# Patient Record
Sex: Male | Born: 1965 | Hispanic: No | Smoking: Former smoker
Health system: Southern US, Community
[De-identification: ages and names within clinical notes are randomized; demographics above are authoritative.]

## PROBLEM LIST (undated history)

## (undated) DIAGNOSIS — E785 Hyperlipidemia, unspecified: Secondary | ICD-10-CM

## (undated) DIAGNOSIS — F419 Anxiety disorder, unspecified: Secondary | ICD-10-CM

## (undated) DIAGNOSIS — F329 Major depressive disorder, single episode, unspecified: Secondary | ICD-10-CM

## (undated) DIAGNOSIS — F32A Depression, unspecified: Secondary | ICD-10-CM

## (undated) DIAGNOSIS — M1990 Unspecified osteoarthritis, unspecified site: Secondary | ICD-10-CM

## (undated) DIAGNOSIS — N200 Calculus of kidney: Secondary | ICD-10-CM

## (undated) DIAGNOSIS — F209 Schizophrenia, unspecified: Secondary | ICD-10-CM

## (undated) DIAGNOSIS — R45851 Suicidal ideations: Secondary | ICD-10-CM

## (undated) DIAGNOSIS — F319 Bipolar disorder, unspecified: Secondary | ICD-10-CM

## (undated) DIAGNOSIS — K648 Other hemorrhoids: Secondary | ICD-10-CM

## (undated) DIAGNOSIS — I10 Essential (primary) hypertension: Secondary | ICD-10-CM

## (undated) DIAGNOSIS — G43909 Migraine, unspecified, not intractable, without status migrainosus: Secondary | ICD-10-CM

## (undated) DIAGNOSIS — R0981 Nasal congestion: Secondary | ICD-10-CM

## (undated) DIAGNOSIS — C44510 Basal cell carcinoma of anal skin: Secondary | ICD-10-CM

## (undated) DIAGNOSIS — Z87828 Personal history of other (healed) physical injury and trauma: Secondary | ICD-10-CM

## (undated) DIAGNOSIS — F410 Panic disorder [episodic paroxysmal anxiety] without agoraphobia: Secondary | ICD-10-CM

## (undated) DIAGNOSIS — K6289 Other specified diseases of anus and rectum: Secondary | ICD-10-CM

## (undated) DIAGNOSIS — F1410 Cocaine abuse, uncomplicated: Secondary | ICD-10-CM

## (undated) HISTORY — PX: MOUTH SURGERY: SHX715

## (undated) HISTORY — DX: Cocaine abuse, uncomplicated: F14.10

## (undated) HISTORY — DX: Hyperlipidemia, unspecified: E78.5

## (undated) HISTORY — DX: Schizophrenia, unspecified: F20.9

## (undated) HISTORY — PX: KNEE SURGERY: SHX244

## (undated) HISTORY — PX: OTHER SURGICAL HISTORY: SHX169

## (undated) HISTORY — DX: Suicidal ideations: R45.851

## (undated) HISTORY — PX: APPENDECTOMY: SHX54

## (undated) HISTORY — DX: Unspecified osteoarthritis, unspecified site: M19.90

---

## 2001-12-10 ENCOUNTER — Emergency Department (HOSPITAL_COMMUNITY): Admission: EM | Admit: 2001-12-10 | Discharge: 2001-12-10 | Payer: Self-pay | Admitting: Emergency Medicine

## 2001-12-10 ENCOUNTER — Encounter: Payer: Self-pay | Admitting: Emergency Medicine

## 2004-04-13 HISTORY — PX: COLONOSCOPY: SHX174

## 2004-07-31 ENCOUNTER — Emergency Department (HOSPITAL_COMMUNITY): Admission: EM | Admit: 2004-07-31 | Discharge: 2004-07-31 | Payer: Self-pay | Admitting: Emergency Medicine

## 2004-08-27 ENCOUNTER — Ambulatory Visit: Payer: Self-pay | Admitting: Internal Medicine

## 2004-10-16 ENCOUNTER — Ambulatory Visit: Payer: Self-pay | Admitting: Internal Medicine

## 2004-10-17 ENCOUNTER — Ambulatory Visit: Payer: Self-pay | Admitting: Internal Medicine

## 2004-10-21 ENCOUNTER — Ambulatory Visit: Payer: Self-pay | Admitting: *Deleted

## 2006-06-18 ENCOUNTER — Ambulatory Visit (HOSPITAL_COMMUNITY): Admission: RE | Admit: 2006-06-18 | Discharge: 2006-06-18 | Payer: Self-pay | Admitting: Family Medicine

## 2011-10-28 DIAGNOSIS — R079 Chest pain, unspecified: Secondary | ICD-10-CM

## 2011-10-30 DIAGNOSIS — R079 Chest pain, unspecified: Secondary | ICD-10-CM

## 2011-12-20 ENCOUNTER — Emergency Department (HOSPITAL_COMMUNITY)
Admission: EM | Admit: 2011-12-20 | Discharge: 2011-12-20 | Disposition: A | Discharge status: Home / Self Care | Payer: Medicaid Other | Attending: Emergency Medicine | Admitting: Emergency Medicine

## 2011-12-20 ENCOUNTER — Emergency Department (HOSPITAL_COMMUNITY): Payer: Medicaid Other

## 2011-12-20 ENCOUNTER — Encounter (HOSPITAL_COMMUNITY): Payer: Self-pay | Admitting: *Deleted

## 2011-12-20 DIAGNOSIS — R112 Nausea with vomiting, unspecified: Secondary | ICD-10-CM | POA: Insufficient documentation

## 2011-12-20 DIAGNOSIS — R079 Chest pain, unspecified: Secondary | ICD-10-CM | POA: Insufficient documentation

## 2011-12-20 DIAGNOSIS — Z79899 Other long term (current) drug therapy: Secondary | ICD-10-CM | POA: Insufficient documentation

## 2011-12-20 DIAGNOSIS — R5383 Other fatigue: Secondary | ICD-10-CM

## 2011-12-20 DIAGNOSIS — R5381 Other malaise: Secondary | ICD-10-CM | POA: Insufficient documentation

## 2011-12-20 DIAGNOSIS — I10 Essential (primary) hypertension: Secondary | ICD-10-CM | POA: Insufficient documentation

## 2011-12-20 HISTORY — DX: Migraine, unspecified, not intractable, without status migrainosus: G43.909

## 2011-12-20 HISTORY — DX: Calculus of kidney: N20.0

## 2011-12-20 HISTORY — DX: Major depressive disorder, single episode, unspecified: F32.9

## 2011-12-20 HISTORY — DX: Bipolar disorder, unspecified: F31.9

## 2011-12-20 HISTORY — DX: Essential (primary) hypertension: I10

## 2011-12-20 HISTORY — DX: Depression, unspecified: F32.A

## 2011-12-20 LAB — BASIC METABOLIC PANEL
BUN: 15 mg/dL (ref 6–23)
CO2: 23 mEq/L (ref 19–32)
Calcium: 9.5 mg/dL (ref 8.4–10.5)
Chloride: 109 mEq/L (ref 96–112)
Creatinine, Ser: 0.92 mg/dL (ref 0.50–1.35)
GFR calc Af Amer: >90 mL/min (ref >90)
GFR calc non Af Amer: >90 mL/min (ref >90)
Glucose, Bld: 101 mg/dL — ABNORMAL HIGH (ref 70–99)
Potassium: 3.7 mEq/L (ref 3.5–5.1)
Sodium: 140 mEq/L (ref 135–145)

## 2011-12-20 LAB — CBC
HCT: 41 % (ref 39.0–52.0)
Hemoglobin: 14.2 g/dL (ref 13.0–17.0)
MCH: 29.5 pg (ref 26.0–34.0)
MCHC: 34.6 g/dL (ref 30.0–36.0)
MCV: 85.1 fL (ref 78.0–100.0)
Platelets: 225 10*3/uL (ref 150–400)
RBC: 4.82 MIL/uL (ref 4.22–5.81)
RDW: 13.1 % (ref 11.5–15.5)
WBC: 6.5 10*3/uL (ref 4.0–10.5)

## 2011-12-20 LAB — TROPONIN I: Troponin I: <0.3 ng/mL (ref <0.30)

## 2011-12-20 MED ORDER — MORPHINE SULFATE 4 MG/ML IJ SOLN
6.0000 mg | Freq: Once | INTRAMUSCULAR | Status: AC
  Administered 2011-12-20: 6 mg via INTRAVENOUS
  Filled 2011-12-20: qty 2

## 2011-12-20 MED ORDER — ASPIRIN 81 MG PO CHEW
324.0000 mg | CHEWABLE_TABLET | Freq: Once | ORAL | Status: AC
  Administered 2011-12-20: 324 mg via ORAL
  Filled 2011-12-20: qty 4

## 2011-12-20 MED ORDER — IBUPROFEN 400 MG PO TABS
400.0000 mg | ORAL_TABLET | Freq: Once | ORAL | Status: AC
  Administered 2011-12-20: 400 mg via ORAL
  Filled 2011-12-20: qty 1

## 2011-12-20 MED ORDER — SODIUM CHLORIDE 0.9 % IV BOLUS (SEPSIS)
1000.0000 mL | Freq: Once | INTRAVENOUS | Status: AC
  Administered 2011-12-20: 1000 mL via INTRAVENOUS

## 2011-12-20 MED ORDER — ONDANSETRON HCL 4 MG/2ML IJ SOLN
4.0000 mg | Freq: Once | INTRAMUSCULAR | Status: AC
  Administered 2011-12-20: 4 mg via INTRAVENOUS
  Filled 2011-12-20: qty 2

## 2011-12-20 MED ORDER — TRAMADOL HCL 50 MG PO TABS: 50.0000 mg | ORAL_TABLET | Freq: Four times a day (QID) | ORAL | Status: AC | PRN

## 2011-12-20 NOTE — ED Provider Notes (Signed)
History    46 year old male with chest pain. Gradual onset about 3 days ago. Pain is in the Center of his chest and does not radiate. Describes it as a constant pressure with occasional sharper pain. No appreciable exacerbating relieving factors. Triage note was reviewed but patient denies shortness of breath to me. No palpitations. No diaphoresis. No unusual leg pain or swelling. Denies history of blood clot. No coughing. CSN: [id number]  Arrival date & time 12/20/11  1751   First MD Initiated Contact with Patient 12/20/11 1759      Chief Complaint  Patient presents with  . Chest Pain  . Nausea  . Emesis    (Consider location/radiation/quality/duration/timing/severity/associated sxs/prior treatment) HPI  Past Medical History  Diagnosis Date  . Bipolar disorder   . Depression   . Panic attack   . Kidney stones   . Hypertension   . Migraine     Past Surgical History  Procedure Date  . Knee surgery   . Mouth surgery     No family history on file.  History  Substance Use Topics  . Smoking status: Current Everyday Smoker  . Smokeless tobacco: Not on file  . Alcohol Use: Yes     occassional      Review of Systems   Review of symptoms negative unless otherwise noted in HPI.   Allergies  Rocephin  Home Medications   Current Outpatient Rx  Name Route Sig Dispense Refill  . ALPRAZOLAM 1 MG PO TABS Oral Take 1 mg by mouth 4 (four) times daily.    . IBUPROFEN 800 MG PO TABS Oral Take 800 mg by mouth every 8 (eight) hours as needed. Pain    . PENICILLIN V POTASSIUM 500 MG PO TABS Oral Take 500 mg by mouth 4 (four) times daily.    . QUETIAPINE FUMARATE 400 MG PO TABS Oral Take 400 mg by mouth at bedtime.      BP 141/89  Pulse 86  Temp 98.3 F (36.8 C)  Resp 18  Ht 5\' 8"  (1.727 m)  Wt 220 lb (99.791 kg)  BMI 33.45 kg/m2  SpO2 98%  Physical Exam  Nursing note and vitals reviewed. Constitutional: He appears well-developed and well-nourished. No distress.       Laying in bed. No acute distress  HENT:  Head: Normocephalic and atraumatic.  Eyes: Conjunctivae normal are normal. Right eye exhibits no discharge. Left eye exhibits no discharge.  Neck: Neck supple.  Cardiovascular: Normal rate, regular rhythm and normal heart sounds.  Exam reveals no gallop and no friction rub.   No murmur heard. Pulmonary/Chest: Effort normal and breath sounds normal. No respiratory distress.       Chest pain is nonreproducible on palpation.  Abdominal: Soft. He exhibits no distension. There is no tenderness.  Musculoskeletal: He exhibits no edema and no tenderness.       Lower extremities symmetric as compared to each other. No calf tenderness. Negative Homan's. No palpable cords.   Neurological: He is alert.  Skin: Skin is warm and dry. He is not diaphoretic.  Psychiatric: He has a normal mood and affect. His behavior is normal. Thought content normal.    ED Course  Procedures (including critical care time)  Labs Reviewed  BASIC METABOLIC PANEL - Abnormal; Notable for the following:    Glucose, Bld 101 (*)     All other components within normal limits  TROPONIN I  CBC   Dg Chest 2 View  12/20/2011  *RADIOLOGY REPORT*  Clinical Data: Chest pain and vomiting.  CHEST - 2 VIEW  Comparison: 10/28/2011.  Findings: The cardiac silhouette, mediastinal and hilar contours are normal and stable.  The lungs are clear.  No pleural effusion. The bony thorax is intact.  IMPRESSION: No acute cardiopulmonary findings.   Original Report Authenticated By: P. Loralie Champagne, M.D.    EKG:  Rhythm: normal sinus Vent. rate 86 BPM PR interval 168 ms QRS duration 90 ms QT/QTc 382/457 ms Axis: normal Intervals/conduction: Q waves in lead 3 ST segments: Normal    1. Chest pain   2. Fatigue       MDM  46 year old male with chest pain. Atypical for ACS given more or less constant pain for several days. Doubt infectious. Chest x-ray is clear. Doubt pulmonary embolism.  Doubt dissection. Feel very low risk chest pain in that patient can be discharged at this time. Return precautions were discussed. Outpatient followup.        Raeford Razor, MD 12/24/11 [phone]

## 2011-12-20 NOTE — ED Notes (Signed)
Mid center chest pain that started a few days ago, n/v, sob, pain is described as pressure that is sharp in nature. Pt also c/o headache

## 2012-03-18 ENCOUNTER — Ambulatory Visit (INDEPENDENT_AMBULATORY_CARE_PROVIDER_SITE_OTHER): Payer: Medicaid Other | Admitting: General Surgery

## 2012-03-18 ENCOUNTER — Encounter (INDEPENDENT_AMBULATORY_CARE_PROVIDER_SITE_OTHER): Payer: Self-pay | Admitting: General Surgery

## 2012-03-18 ENCOUNTER — Encounter (HOSPITAL_BASED_OUTPATIENT_CLINIC_OR_DEPARTMENT_OTHER): Payer: Self-pay | Admitting: *Deleted

## 2012-03-18 VITALS — BP 120/80 | HR 90 | Temp 97.6°F | Resp 12 | Ht 68.0 in | Wt 221.5 lb

## 2012-03-18 DIAGNOSIS — C44510 Basal cell carcinoma of anal skin: Secondary | ICD-10-CM

## 2012-03-18 DIAGNOSIS — C44519 Basal cell carcinoma of skin of other part of trunk: Secondary | ICD-10-CM

## 2012-03-18 HISTORY — DX: Basal cell carcinoma of anal skin: C44.510

## 2012-03-18 NOTE — Progress Notes (Signed)
NPO AFTER MN WITH EXCEPTION CLEAR LIQUIDS UNTIL 0800. (NO CREAM/ MILK PRODUCTS). PT VERBALIZED UNDERSTANDING THIS INCLUDES NO SNUFF/ CHEW.  ARRIVES AT 1300. NEEDS HG.

## 2012-03-18 NOTE — Progress Notes (Signed)
Chief Complaint  Patient presents with  . Cancer    Anal    HISTORY: Larry Hamilton is a 46 y.o. male who presents to the office with a perianal mass.  Other symptoms include bleeding and itching.  This had been present for 6 yrs.  His bowel habits are regular and his bowel movements are soft.  His fiber intake is moderate.    His last colonoscopy was in 2006 per pt and was neg except for internal hemorrhoids.  Past Medical History  Diagnosis Date  . Bipolar disorder   . Depression   . Panic attack   . Kidney stones   . Hypertension   . Migraine   . Arthritis   . Cancer   . Hyperlipidemia       Past Surgical History  Procedure Date  . Knee surgery   . Mouth surgery         Current Outpatient Prescriptions  Medication Sig Dispense Refill  . ALPRAZolam (XANAX) 1 MG tablet Take 1 mg by mouth 4 (four) times daily.      Marland Kitchen atorvastatin (LIPITOR) 40 MG tablet Take 40 mg by mouth daily.      Marland Kitchen ibuprofen (ADVIL,MOTRIN) 800 MG tablet Take 800 mg by mouth every 8 (eight) hours as needed. Pain      . QUEtiapine (SEROQUEL) 400 MG tablet Take 400 mg by mouth at bedtime.          Allergies  Allergen Reactions  . Rocephin (Ceftriaxone Sodium In Dextrose)       Family History  Problem Relation Age of Onset  . Cancer Father     Leukemia    History   Social History  . Marital Status: Single    Spouse Name: N/A    Number of Children: N/A  . Years of Education: N/A   Social History Main Topics  . Smoking status: Current Every Day Smoker  . Smokeless tobacco: None  . Alcohol Use: Yes     Comment: occassional  . Drug Use: No  . Sexually Active:    Other Topics Concern  . None   Social History Narrative  . None      REVIEW OF SYSTEMS - PERTINENT POSITIVES ONLY: Review of Systems - General ROS: negative for - chills or fever Hematological and Lymphatic ROS: negative for - bleeding problems or blood clots GI: no bleeding with BM's, pos for  occ abd pain,  epigastric  EXAM: Filed Vitals:   03/18/12 0922  BP: 120/80  Pulse: 90  Temp: 97.6 F (36.4 C)  Resp: 12    General appearance: alert and cooperative Resp: clear to auscultation bilaterally Cardio: regular rate and rhythm GI: soft, non-tender; bowel sounds normal; no masses,  no organomegaly,  Upper abd diastasis, no palpable hernias   Procedure: Anoscopy Surgeon: Maisie Fus Diagnosis: anal mass  Assistant: Glaspey After the risks and benefits were explained, verbal consent was obtained for above procedure  Anesthesia: none Findings: R ant internal hemorrhoid enlarged, R ant perianal lesion (~4x2cm)   ASSESSMENT AND PLAN: Larry Hamilton is a 46 y.o. M who was referred to me for a basal cell carcinoma of the perianal region.  He also appears to have an enlarged internal hemorrhoid.  We will perform a wide local excision of this mass.  If this does not require too much skin resection, I will proceed with hemorrhoidectomy as well.  I am hopeful this will resole his symptoms.  Vanita Panda, MD Colon and Rectal Surgery / General Surgery [facility]      Visit Diagnoses: 1. Primary basal cell carcinoma of anus     Primary Care Physician: Toma Deiters, MD

## 2012-03-18 NOTE — Patient Instructions (Addendum)
Shopping List- Supplies to purchase before rectal surgery  1. 100% cotton balls- you may also buy a roll of cotton and tear off a small piece or buy large cotton rounds in a bag.  You will wear a small piece next to the anal opening as a dressing following surgery.  Make sure they are 100% cotton and not rayon,which can be irritating to your skin.  2 Stool softener (Docusate Sodium or Colace)- The generic equivalent is fine.  These can be purchased over the counter.  You may want to start taking these (one in the morning and one in the evening) three days prior to surgery, so that you bowel movements after surgery will be soft.  Continue taking them until it no longer hurts to have a bowel movement.   3. Fiber supplement- such as Metamucil, FiberCon, Benefiber or Citrucel.  Take one dose in the morning and one in the evening to help reach a goal of 25g of fiber per day.  You can find this information on the label of the supplement. 4. Naproxen or Ibuprofen- this will be taken with your prescription pain medications to assist in your pain control after surgery. 5. Laxative- Milk of Magnesia or a generic equivalent will be fine.  You may need to use this if you become constipated after surgery. 6. Prune juice- just an ounce or two in the morning may keep you regular without having to use any laxatives 7. Portable Sitz bath (Optional)- recommended if you have difficulty getting in and out of a tub multiple times a day.

## 2012-03-21 ENCOUNTER — Encounter (HOSPITAL_BASED_OUTPATIENT_CLINIC_OR_DEPARTMENT_OTHER): Payer: Self-pay | Admitting: *Deleted

## 2012-03-21 ENCOUNTER — Ambulatory Visit (HOSPITAL_BASED_OUTPATIENT_CLINIC_OR_DEPARTMENT_OTHER): Payer: Medicaid Other | Admitting: Anesthesiology

## 2012-03-21 ENCOUNTER — Encounter (HOSPITAL_BASED_OUTPATIENT_CLINIC_OR_DEPARTMENT_OTHER): Payer: Self-pay | Admitting: Anesthesiology

## 2012-03-21 ENCOUNTER — Ambulatory Visit (HOSPITAL_BASED_OUTPATIENT_CLINIC_OR_DEPARTMENT_OTHER)
Admission: RE | Admit: 2012-03-21 | Discharge: 2012-03-21 | Disposition: A | Discharge status: Home / Self Care | Payer: Medicaid Other | Source: Ambulatory Visit | Attending: General Surgery | Admitting: General Surgery

## 2012-03-21 ENCOUNTER — Encounter (HOSPITAL_BASED_OUTPATIENT_CLINIC_OR_DEPARTMENT_OTHER)
Admission: RE | Disposition: A | Discharge status: Home / Self Care | Payer: Self-pay | Source: Ambulatory Visit | Attending: General Surgery

## 2012-03-21 DIAGNOSIS — C44519 Basal cell carcinoma of skin of other part of trunk: Secondary | ICD-10-CM

## 2012-03-21 DIAGNOSIS — Z79899 Other long term (current) drug therapy: Secondary | ICD-10-CM | POA: Insufficient documentation

## 2012-03-21 DIAGNOSIS — K648 Other hemorrhoids: Secondary | ICD-10-CM

## 2012-03-21 DIAGNOSIS — E785 Hyperlipidemia, unspecified: Secondary | ICD-10-CM | POA: Insufficient documentation

## 2012-03-21 DIAGNOSIS — I10 Essential (primary) hypertension: Secondary | ICD-10-CM | POA: Insufficient documentation

## 2012-03-21 HISTORY — DX: Other specified diseases of anus and rectum: K62.89

## 2012-03-21 HISTORY — PX: RECTAL BIOPSY: SHX2303

## 2012-03-21 HISTORY — DX: Anxiety disorder, unspecified: F41.9

## 2012-03-21 HISTORY — DX: Other hemorrhoids: K64.8

## 2012-03-21 HISTORY — DX: Basal cell carcinoma of anal skin: C44.510

## 2012-03-21 HISTORY — DX: Personal history of other (healed) physical injury and trauma: Z87.828

## 2012-03-21 HISTORY — DX: Nasal congestion: R09.81

## 2012-03-21 HISTORY — DX: Panic disorder (episodic paroxysmal anxiety): F41.0

## 2012-03-21 HISTORY — PX: HEMORRHOID SURGERY: SHX153

## 2012-03-21 LAB — POCT I-STAT, CHEM 8
BUN: 11 mg/dL (ref 6–23)
Chloride: 105 mEq/L (ref 96–112)
Glucose, Bld: 91 mg/dL (ref 70–99)
HCT: 42 % (ref 39.0–52.0)
Potassium: 3.7 mEq/L (ref 3.5–5.1)

## 2012-03-21 SURGERY — BIOPSY, RECTUM
Anesthesia: General | Site: Rectum | Wound class: Clean Contaminated

## 2012-03-21 MED ORDER — SODIUM CHLORIDE 0.9 % IV SOLN
250.0000 mL | INTRAVENOUS | Status: DC | PRN
  Filled 2012-03-21: qty 250

## 2012-03-21 MED ORDER — BUPIVACAINE LIPOSOME 1.3 % IJ SUSP
INTRAMUSCULAR | Status: DC | PRN
  Administered 2012-03-21: 20 mL

## 2012-03-21 MED ORDER — FENTANYL CITRATE 0.05 MG/ML IJ SOLN
INTRAMUSCULAR | Status: DC | PRN
  Administered 2012-03-21: 25 ug via INTRAVENOUS
  Administered 2012-03-21: 50 ug via INTRAVENOUS
  Administered 2012-03-21: 100 ug via INTRAVENOUS
  Administered 2012-03-21: 25 ug via INTRAVENOUS

## 2012-03-21 MED ORDER — LACTATED RINGERS IV SOLN
INTRAVENOUS | Status: DC
  Administered 2012-03-21: 14:00:00 via INTRAVENOUS
  Filled 2012-03-21: qty 1000

## 2012-03-21 MED ORDER — ONDANSETRON HCL 4 MG/2ML IJ SOLN
4.0000 mg | Freq: Four times a day (QID) | INTRAMUSCULAR | Status: DC | PRN
  Filled 2012-03-21: qty 2

## 2012-03-21 MED ORDER — KETOROLAC TROMETHAMINE 30 MG/ML IJ SOLN
INTRAMUSCULAR | Status: DC | PRN
  Administered 2012-03-21: 30 mg via INTRAVENOUS

## 2012-03-21 MED ORDER — LIDOCAINE HCL (CARDIAC) 20 MG/ML IV SOLN
INTRAVENOUS | Status: DC | PRN
  Administered 2012-03-21: 100 mg via INTRAVENOUS

## 2012-03-21 MED ORDER — ACETAMINOPHEN 325 MG PO TABS
650.0000 mg | ORAL_TABLET | ORAL | Status: DC | PRN
  Filled 2012-03-21: qty 2

## 2012-03-21 MED ORDER — ACETAMINOPHEN 650 MG RE SUPP
650.0000 mg | RECTAL | Status: DC | PRN
  Filled 2012-03-21: qty 1

## 2012-03-21 MED ORDER — PROMETHAZINE HCL 25 MG/ML IJ SOLN
6.2500 mg | INTRAMUSCULAR | Status: DC | PRN
  Filled 2012-03-21: qty 1

## 2012-03-21 MED ORDER — SODIUM CHLORIDE 0.9 % IJ SOLN
3.0000 mL | INTRAMUSCULAR | Status: DC | PRN
  Filled 2012-03-21: qty 3

## 2012-03-21 MED ORDER — MIDAZOLAM HCL 5 MG/5ML IJ SOLN
INTRAMUSCULAR | Status: DC | PRN
  Administered 2012-03-21: 2 mg via INTRAVENOUS

## 2012-03-21 MED ORDER — SUCCINYLCHOLINE CHLORIDE 20 MG/ML IJ SOLN
INTRAMUSCULAR | Status: DC | PRN
  Administered 2012-03-21: 200 mg via INTRAVENOUS

## 2012-03-21 MED ORDER — DIAZEPAM 5 MG PO TABS
5.0000 mg | ORAL_TABLET | Freq: Three times a day (TID) | ORAL | Status: AC | PRN
  Administered 2012-03-21: 5 mg via ORAL
  Filled 2012-03-21: qty 1

## 2012-03-21 MED ORDER — LACTATED RINGERS IV SOLN
INTRAVENOUS | Status: DC | PRN
  Administered 2012-03-21 (×2): via INTRAVENOUS

## 2012-03-21 MED ORDER — PSYLLIUM 28 % PO PACK: 1.0000 | PACK | Freq: Two times a day (BID) | ORAL | Status: DC

## 2012-03-21 MED ORDER — OXYCODONE HCL 5 MG PO TABS
5.0000 mg | ORAL_TABLET | ORAL | Status: DC | PRN
  Administered 2012-03-21: 5 mg via ORAL
  Filled 2012-03-21: qty 2

## 2012-03-21 MED ORDER — SODIUM CHLORIDE 0.9 % IJ SOLN
3.0000 mL | Freq: Two times a day (BID) | INTRAMUSCULAR | Status: DC
  Filled 2012-03-21: qty 3

## 2012-03-21 MED ORDER — FENTANYL CITRATE 0.05 MG/ML IJ SOLN
25.0000 ug | INTRAMUSCULAR | Status: DC | PRN
  Administered 2012-03-21: 25 ug via INTRAVENOUS
  Filled 2012-03-21: qty 1

## 2012-03-21 MED ORDER — DEXAMETHASONE SODIUM PHOSPHATE 4 MG/ML IJ SOLN
INTRAMUSCULAR | Status: DC | PRN
  Administered 2012-03-21: 10 mg via INTRAVENOUS

## 2012-03-21 MED ORDER — BUPIVACAINE-EPINEPHRINE 0.25% -1:200000 IJ SOLN
INTRAMUSCULAR | Status: DC | PRN
  Administered 2012-03-21: 20 mL

## 2012-03-21 MED ORDER — ONDANSETRON HCL 4 MG/2ML IJ SOLN
INTRAMUSCULAR | Status: DC | PRN
  Administered 2012-03-21: 4 mg via INTRAVENOUS

## 2012-03-21 MED ORDER — OXYCODONE HCL 5 MG PO TABS: 5.0000 mg | ORAL_TABLET | ORAL | Status: DC | PRN

## 2012-03-21 MED ORDER — PROPOFOL 10 MG/ML IV BOLUS
INTRAVENOUS | Status: DC | PRN
  Administered 2012-03-21: 200 mg via INTRAVENOUS
  Administered 2012-03-21: 50 mg via INTRAVENOUS

## 2012-03-21 MED ORDER — DOCUSATE SODIUM 100 MG PO CAPS: 100.0000 mg | ORAL_CAPSULE | Freq: Two times a day (BID) | ORAL | Status: DC

## 2012-03-21 MED ORDER — DIAZEPAM 5 MG PO TABS: 5.0000 mg | ORAL_TABLET | Freq: Four times a day (QID) | ORAL | Status: DC | PRN

## 2012-03-21 SURGICAL SUPPLY — 42 items
BLADE HEX COATED 2.75 (ELECTRODE) ×2 IMPLANT
BLADE SURG 15 STRL LF DISP TIS (BLADE) ×1 IMPLANT
BLADE SURG 15 STRL SS (BLADE) ×2
CANISTER SUCTION 2500CC (MISCELLANEOUS) ×2 IMPLANT
CLOTH BEACON ORANGE TIMEOUT ST (SAFETY) ×2 IMPLANT
DECANTER SPIKE VIAL GLASS SM (MISCELLANEOUS) ×2 IMPLANT
DRAPE LG THREE QUARTER DISP (DRAPES) ×4 IMPLANT
DRAPE UNDERBUTTOCKS STRL (DRAPE) ×2 IMPLANT
DRSG PAD ABDOMINAL 8X10 ST (GAUZE/BANDAGES/DRESSINGS) ×1 IMPLANT
ELECT REM PT RETURN 9FT ADLT (ELECTROSURGICAL) ×2
ELECTRODE REM PT RTRN 9FT ADLT (ELECTROSURGICAL) ×1 IMPLANT
EXPAREL IMPLANT
GAUZE SPONGE 4X4 16PLY XRAY LF (GAUZE/BANDAGES/DRESSINGS) IMPLANT
GAUZE VASELINE 3X9 (GAUZE/BANDAGES/DRESSINGS) IMPLANT
GLOVE BIO SURGEON STRL SZ 6.5 (GLOVE) ×4 IMPLANT
GLOVE BIOGEL PI IND STRL 7.0 (GLOVE) ×2 IMPLANT
GLOVE BIOGEL PI INDICATOR 7.0 (GLOVE) ×2
GOWN PREVENTION PLUS LG XLONG (DISPOSABLE) ×2 IMPLANT
GOWN PREVENTION PLUS XLARGE (GOWN DISPOSABLE) ×2 IMPLANT
GOWN STRL NON-REIN LRG LVL3 (GOWN DISPOSABLE) ×2 IMPLANT
GOWN STRL REIN XL XLG (GOWN DISPOSABLE) ×2 IMPLANT
LEGGING LITHOTOMY PAIR STRL (DRAPES) ×2 IMPLANT
NDL HYPO 25X1 1.5 SAFETY (NEEDLE) ×1 IMPLANT
NDL SAFETY ECLIPSE 18X1.5 (NEEDLE) IMPLANT
NEEDLE HYPO 18GX1.5 SHARP (NEEDLE)
NEEDLE HYPO 25X1 1.5 SAFETY (NEEDLE) ×2 IMPLANT
NS IRRIG 500ML POUR BTL (IV SOLUTION) ×2 IMPLANT
PACK BASIN DAY SURGERY FS (CUSTOM PROCEDURE TRAY) ×2 IMPLANT
PENCIL BUTTON HOLSTER BLD 10FT (ELECTRODE) ×2 IMPLANT
SPONGE GAUZE 4X4 12PLY (GAUZE/BANDAGES/DRESSINGS) ×1 IMPLANT
SPONGE SURGIFOAM ABS GEL 100 (HEMOSTASIS) ×1 IMPLANT
SPONGE SURGIFOAM ABS GEL 12-7 (HEMOSTASIS) IMPLANT
SUT CHROMIC 2 0 SH (SUTURE) ×1 IMPLANT
SUT CHROMIC 3 0 SH 27 (SUTURE) ×1 IMPLANT
SUT MON AB 3-0 SH 27 (SUTURE)
SUT MON AB 3-0 SH27 (SUTURE) IMPLANT
SUT VIC AB 4-0 P-3 18XBRD (SUTURE) IMPLANT
SUT VIC AB 4-0 P3 18 (SUTURE)
SYR CONTROL 10ML LL (SYRINGE) ×2 IMPLANT
TOWEL OR 17X26 10 PK STRL BLUE (TOWEL DISPOSABLE) ×2 IMPLANT
TRAY DSU PREP LF (CUSTOM PROCEDURE TRAY) ×2 IMPLANT
TUBE CONNECTING 12X1/4 (SUCTIONS) ×2 IMPLANT

## 2012-03-21 NOTE — Op Note (Signed)
03/21/2012  3:22 PM  PATIENT:  Larry Hamilton  46 y.o. male  Patient Care Team: Toma Deiters, MD as PCP - General (Unknown Physician Specialty)  PRE-OPERATIVE DIAGNOSIS:  ANAL MASS AND HEMORRHOIDS  POST-OPERATIVE DIAGNOSIS:  ANAL MASS AND HEMORRHOIDS  PROCEDURE:  Procedure(s): Wide local excision, perianal region (~3cm x2cm) HEMORRHOIDECTOMY  SURGEON:  Surgeon(s): Romie Levee, MD  ASSISTANT: none   ANESTHESIA:   local and general  EBL: 20ml  Total I/O In: 1000 [I.V.:1000] Out: -   Delay start of Pharmacological VTE agent (>24hrs) due to surgical blood loss or risk of bleeding:  not applicable  DRAINS: none   SPECIMEN:  Source of Specimen:  hemorrhoid, anterior  Right lateral perianal region basal cell carcinoma  DISPOSITION OF SPECIMEN:  PATHOLOGY  COUNTS:  YES  PLAN OF CARE: Discharge to home after PACU  PATIENT DISPOSITION:  PACU - hemodynamically stable.  INDICATION: this is a 46 year old male who presents to my office with a biopsy-proven basal cell carcinoma of the perianal region as well as internal hemorrhoids.  OR FINDINGS: right lateral perianal basal cell carcinoma. Small internal anterior anal canal hemorrhoid  DESCRIPTION: the patient was identified in the preoperative holding area and taken to the OR where general anesthesia was smoothly induced. He was then laid prone on the operating room table in jackknife position. His buttocks were gently taped apart and he was prepped and draped in the usual sterile fashion. A surgical timeout was performed and again the correct patient, procedure, positioning and preoperative antibiotics need. SCDs were also been placed prior to the initiation of anesthesia. Once this was completed and internal anal exam was performed using a Hill-Ferguson anoscope. The patient was noted to have a small anterior internal hemorrhoid and no other major internal disease. He was noted to have a perianal mass consistent with basal  cell carcinoma in the right lateral position. I began with the internal hemorrhoidectomy first. This was incised using a scalpel and carefully dissected from the muscle layer. This was sent to pathology for further examination. The hemorrhoid excision site was closed using a 3-0 chromic running suture. After this was completed I turned my attention to the basal cell carcinoma. Approximately 5 mm margin was attempted on all sides. An elliptical incision was made around the basal cell carcinoma using an 15 blade scalpel. I then used Metzenbaum scissors to separate the lesion from the underlying muscle layer. I marked the lesion with a long stitch on the anterior border and a short stitch on the perianal border away from the anal canal.  This was sent to pathology for further examination. I then closed the excision site using a running 2-0 chromic suture. I then infused exparel in to the subcutaneous tissues for anesthesia locally. The patient was then awakened from anesthesia and sent to the postanesthesia care unit in stable condition. All counts were correct per operating room staff.

## 2012-03-21 NOTE — Anesthesia Postprocedure Evaluation (Signed)
  Anesthesia Post-op Note  Patient: Larry Hamilton  Procedure(s) Performed: Procedure(s) (LRB): BIOPSY RECTAL (N/A) HEMORRHOIDECTOMY (N/A)  Patient Location: PACU  Anesthesia Type: General  Level of Consciousness: awake and alert   Airway and Oxygen Therapy: Patient Spontanous Breathing  Post-op Pain: mild  Post-op Assessment: Post-op Vital signs reviewed, Patient's Cardiovascular Status Stable, Respiratory Function Stable, Patent Airway and No signs of Nausea or vomiting  Last Vitals:  Filed Vitals:   03/21/12 1555  BP: 137/88  Pulse:   Temp:   Resp:     Post-op Vital Signs: stable   Complications: No apparent anesthesia complications. Patient is wide awake with no sedation. Taking PO well. No complaints. Wife at bedside.

## 2012-03-21 NOTE — H&P (View-Only) (Signed)
Chief Complaint  Patient presents with  . Cancer    Anal    HISTORY: Larry Hamilton is a 46 y.o. male who presents to the office with a perianal mass.  Other symptoms include bleeding and itching.  This had been present for 6 yrs.  His bowel habits are regular and his bowel movements are soft.  His fiber intake is moderate.    His last colonoscopy was in 2006 per pt and was neg except for internal hemorrhoids.  Past Medical History  Diagnosis Date  . Bipolar disorder   . Depression   . Panic attack   . Kidney stones   . Hypertension   . Migraine   . Arthritis   . Cancer   . Hyperlipidemia       Past Surgical History  Procedure Date  . Knee surgery   . Mouth surgery         Current Outpatient Prescriptions  Medication Sig Dispense Refill  . ALPRAZolam (XANAX) 1 MG tablet Take 1 mg by mouth 4 (four) times daily.      . atorvastatin (LIPITOR) 40 MG tablet Take 40 mg by mouth daily.      . ibuprofen (ADVIL,MOTRIN) 800 MG tablet Take 800 mg by mouth every 8 (eight) hours as needed. Pain      . QUEtiapine (SEROQUEL) 400 MG tablet Take 400 mg by mouth at bedtime.          Allergies  Allergen Reactions  . Rocephin (Ceftriaxone Sodium In Dextrose)       Family History  Problem Relation Age of Onset  . Cancer Father     Leukemia    History   Social History  . Marital Status: Single    Spouse Name: N/A    Number of Children: N/A  . Years of Education: N/A   Social History Main Topics  . Smoking status: Current Every Day Smoker  . Smokeless tobacco: None  . Alcohol Use: Yes     Comment: occassional  . Drug Use: No  . Sexually Active:    Other Topics Concern  . None   Social History Narrative  . None      REVIEW OF SYSTEMS - PERTINENT POSITIVES ONLY: Review of Systems - General ROS: negative for - chills or fever Hematological and Lymphatic ROS: negative for - bleeding problems or blood clots GI: no bleeding with BM's, pos for  occ abd pain,  epigastric  EXAM: Filed Vitals:   03/18/12 0922  BP: 120/80  Pulse: 90  Temp: 97.6 F (36.4 C)  Resp: 12    General appearance: alert and cooperative Resp: clear to auscultation bilaterally Cardio: regular rate and rhythm GI: soft, non-tender; bowel sounds normal; no masses,  no organomegaly,  Upper abd diastasis, no palpable hernias   Procedure: Anoscopy Surgeon: Vontae Court Diagnosis: anal mass  Assistant: Glaspey After the risks and benefits were explained, verbal consent was obtained for above procedure  Anesthesia: none Findings: R ant internal hemorrhoid enlarged, R ant perianal lesion (~4x2cm)   ASSESSMENT AND PLAN: Larry Hamilton is a 46 y.o. M who was referred to me for a basal cell carcinoma of the perianal region.  He also appears to have an enlarged internal hemorrhoid.  We will perform a wide local excision of this mass.  If this does not require too much skin resection, I will proceed with hemorrhoidectomy as well.  I am hopeful this will resole his symptoms.        Apple Dearmas C Romolo Sieling, MD Colon and Rectal Surgery / General Surgery [facility]      Visit Diagnoses: 1. Primary basal cell carcinoma of anus     Primary Care Physician: HASANAJ,XAJE A, MD   

## 2012-03-21 NOTE — Transfer of Care (Signed)
Immediate Anesthesia Transfer of Care Note  Patient: Larry Hamilton  Procedure(s) Performed: Procedure(s) (LRB): BIOPSY RECTAL (N/A) HEMORRHOIDECTOMY (N/A)  Patient Location: PACU  Anesthesia Type: General  Level of Consciousness: awake, sedated, patient cooperative and responds to stimulation  Airway & Oxygen Therapy: Patient Spontanous Breathing and Patient connected to face mask oxygen  Post-op Assessment: Report given to PACU RN, Post -op Vital signs reviewed and stable and Patient moving all extremities  Post vital signs: Reviewed and stable  Complications: No apparent anesthesia complications

## 2012-03-21 NOTE — Anesthesia Procedure Notes (Signed)
Procedure Name: Intubation Date/Time: 03/21/2012 2:26 PM Performed by: Jessica Priest Pre-anesthesia Checklist: Patient identified, Emergency Drugs available, Suction available and Patient being monitored Patient Re-evaluated:Patient Re-evaluated prior to inductionOxygen Delivery Method: Circle System Utilized Preoxygenation: Pre-oxygenation with 100% oxygen Intubation Type: Rapid sequence Ventilation: Mask ventilation without difficulty Laryngoscope Size: Mac and 4 Grade View: Grade IV Tube type: Oral Tube size: 8.0 mm Number of attempts: 3 Airway Equipment and Method: stylet,  oral airway,  Bougie stylet and Stylet Placement Confirmation: ETT inserted through vocal cords under direct vision,  positive ETCO2 and breath sounds checked- equal and bilateral Secured at: 25 cm Tube secured with: Tape Dental Injury: Teeth and Oropharynx as per pre-operative assessment  Difficulty Due To: Difficult Airway- due to anterior larynx Comments: LCB attemptedf oral intubation - DL x 1, unable to visualize, Dr Payton Doughty assisted, using bouge able to intubate blindly BBS equal positive ETCO2  Sao2 remained above 96 % Taped well.

## 2012-03-21 NOTE — Anesthesia Preprocedure Evaluation (Signed)
Anesthesia Evaluation  Patient identified by MRN, date of birth, ID band Patient awake    Reviewed: Allergy & Precautions, H&P , NPO status , Patient's Chart, lab work & pertinent test results, reviewed documented beta blocker date and time   Airway Mallampati: II TM Distance: >3 FB Neck ROM: full    Dental No notable dental hx.    Pulmonary neg pulmonary ROS,  breath sounds clear to auscultation  Pulmonary exam normal       Cardiovascular Exercise Tolerance: Good hypertension, Rhythm:regular Rate:Normal     Neuro/Psych  Headaches, PSYCHIATRIC DISORDERS Anxiety Depression Bipolar Disorder Panic attacks.S/P closed head injury with hematoma. Residual headaches. negative neurological ROS     GI/Hepatic negative GI ROS, Neg liver ROS,   Endo/Other  negative endocrine ROS  Renal/GU Renal diseasenegative Renal ROS  negative genitourinary   Musculoskeletal   Abdominal   Peds  Hematology negative hematology ROS (+)   Anesthesia Other Findings   Reproductive/Obstetrics negative OB ROS                           Anesthesia Physical Anesthesia Plan  ASA: III  Anesthesia Plan: General ETT   Post-op Pain Management:    Induction:   Airway Management Planned:   Additional Equipment:   Intra-op Plan:   Post-operative Plan:   Informed Consent: I have reviewed the patients History and Physical, chart, labs and discussed the procedure including the risks, benefits and alternatives for the proposed anesthesia with the patient or authorized representative who has indicated his/her understanding and acceptance.   Dental Advisory Given  Plan Discussed with: CRNA  Anesthesia Plan Comments:         Anesthesia Quick Evaluation

## 2012-03-21 NOTE — Interval H&P Note (Signed)
History and Physical Interval Note:  03/21/2012 1:59 PM  Larry Hamilton  has presented today for surgery, with the diagnosis of ANAL MASS AND HEMORRHOIDS  The various methods of treatment have been discussed with the patient and family. After consideration of risks, benefits and other options for treatment, the patient has consented to  Procedure(s) (LRB) with comments: BIOPSY RECTAL (N/A) - WIDE LOCAL EXCISON OF ANAL MASS, POSSIBLE HEMORRHOIDECTOMY HEMORRHOIDECTOMY (N/A) as a surgical intervention .  The patient's history has been reviewed, patient examined, no change in status, stable for surgery.  I have reviewed the patient's chart and labs.  Questions were answered to the patient's satisfaction.   Prior to the operation we did discuss the risks and benefits of this procedure. These include recurrence of his cancer recurrence of this hemorrhoid. As well as short term complications of bleeding, infection, inability to urinate and postoperative pain. I believe he understands these risk and has agreed to proceed with surgery.  Vanita Panda, MD  Colorectal and General Surgery [facility]

## 2012-03-22 ENCOUNTER — Encounter (HOSPITAL_BASED_OUTPATIENT_CLINIC_OR_DEPARTMENT_OTHER): Payer: Self-pay | Admitting: General Surgery

## 2012-03-28 ENCOUNTER — Telehealth (INDEPENDENT_AMBULATORY_CARE_PROVIDER_SITE_OTHER): Payer: Self-pay

## 2012-03-28 NOTE — Telephone Encounter (Signed)
I tried to reach the pt to let him know Dr Maisie Fus wants to speak with him.  I was unable to get him to answer.

## 2012-03-28 NOTE — Telephone Encounter (Signed)
Message copied by Ivory Broad on Mon Mar 28, 2012  3:28 PM ------      Message from: Isaias Sakai K      Created: Mon Mar 28, 2012  3:02 PM      Regarding: Dr Maisie Fus      Contact: [phone]       Patient rx filled (oxycodone and diazepam). Says he will pick up @ [facility] in [place] 

## 2012-03-29 NOTE — Telephone Encounter (Signed)
I rec'd a call from the pt and he is going to his mom's house today.  He will be at [phone].  I will let Dr Maisie Fus know.

## 2012-03-30 ENCOUNTER — Telehealth (INDEPENDENT_AMBULATORY_CARE_PROVIDER_SITE_OTHER): Payer: Self-pay

## 2012-03-30 NOTE — Telephone Encounter (Signed)
I have tried to call him multiple times on multiple days using 3 different numbers and no one has answered.

## 2012-03-30 NOTE — Telephone Encounter (Signed)
Patient calling into office requesting to speak with Dr. Maisie Fus.  Patient would like a return call.

## 2012-03-31 ENCOUNTER — Telehealth (INDEPENDENT_AMBULATORY_CARE_PROVIDER_SITE_OTHER): Payer: Self-pay

## 2012-03-31 NOTE — Telephone Encounter (Signed)
I contacted the pt.  He is at his girlfriend's house.  He was upstairs in bed and didn't answer the phone.   His girlfriend got him to the phone.  He is in a lot of pain and feels like he has to have a bm all the time.  He is on metamucil, prune juice and a stool softener.  I told him Dr Maisie Fus has been playing phone tag trying to reach him.  I explained to him about his pathology result having a positive margin.  She wants to take him back to surgery asap.  He asked about refilling his pain medicine again.  I told him she declined it until he has surgery again.  I told him we will need to call him back about surgery to make sure he picks up the phone.  He said we can talk to his girlfriend Inetta Fermo but I said we have nothing signed stating we can.  He will be at her house today at [phone].

## 2012-04-01 ENCOUNTER — Telehealth (INDEPENDENT_AMBULATORY_CARE_PROVIDER_SITE_OTHER): Payer: Self-pay

## 2012-04-01 NOTE — Telephone Encounter (Signed)
I called the pt and let him know Dr Maisie Fus wants to give him Vicodin #40. I will call this in to [facility] in [place].  I made sure he is doing sitz baths tid.  He is 3 to 4 times a day.  He is awaiting a call back from Debbie to get scheduled.  I called in Vicodin 5/500 # 40 take 1 po q 4-6 prn no refill to [facility] [phone]

## 2012-04-04 ENCOUNTER — Telehealth (INDEPENDENT_AMBULATORY_CARE_PROVIDER_SITE_OTHER): Payer: Self-pay

## 2012-04-04 ENCOUNTER — Encounter (HOSPITAL_COMMUNITY): Payer: Self-pay | Admitting: Pharmacy Technician

## 2012-04-04 ENCOUNTER — Encounter (HOSPITAL_COMMUNITY): Payer: Self-pay | Admitting: *Deleted

## 2012-04-04 ENCOUNTER — Other Ambulatory Visit (INDEPENDENT_AMBULATORY_CARE_PROVIDER_SITE_OTHER): Payer: Self-pay | Admitting: General Surgery

## 2012-04-04 NOTE — Telephone Encounter (Signed)
I called the pt back.  He asked if there is any prep required and I told him there is not.

## 2012-04-04 NOTE — Progress Notes (Signed)
Dr Maisie Fus-   Need pre op orders please entered in EPIC-   FIRST CASE Thursday  [facility]

## 2012-04-04 NOTE — Telephone Encounter (Signed)
Message copied by Ivory Broad on Mon Apr 04, 2012 11:50 AM ------      Message from: Larry Hamilton      Created: Mon Apr 04, 2012 10:45 AM      Regarding: Dr Glenna Durand: [phone]       Patient would like to discuss sx on 04/07/12. Has question about taking his medications

## 2012-04-07 ENCOUNTER — Encounter (HOSPITAL_BASED_OUTPATIENT_CLINIC_OR_DEPARTMENT_OTHER): Payer: Self-pay

## 2012-04-07 ENCOUNTER — Encounter (HOSPITAL_COMMUNITY): Payer: Self-pay | Admitting: Anesthesiology

## 2012-04-07 ENCOUNTER — Encounter (HOSPITAL_COMMUNITY): Payer: Self-pay | Admitting: *Deleted

## 2012-04-07 ENCOUNTER — Encounter (HOSPITAL_COMMUNITY)
Admission: RE | Disposition: A | Discharge status: Home / Self Care | Payer: Self-pay | Source: Ambulatory Visit | Attending: General Surgery

## 2012-04-07 ENCOUNTER — Ambulatory Visit (HOSPITAL_COMMUNITY)
Admission: RE | Admit: 2012-04-07 | Discharge: 2012-04-07 | Disposition: A | Discharge status: Home / Self Care | Payer: Medicaid Other | Source: Ambulatory Visit | Attending: General Surgery | Admitting: General Surgery

## 2012-04-07 ENCOUNTER — Ambulatory Visit (HOSPITAL_BASED_OUTPATIENT_CLINIC_OR_DEPARTMENT_OTHER): Admit: 2012-04-07 | Payer: Self-pay | Admitting: General Surgery

## 2012-04-07 ENCOUNTER — Ambulatory Visit (HOSPITAL_COMMUNITY): Payer: Medicaid Other | Admitting: Anesthesiology

## 2012-04-07 DIAGNOSIS — C4491 Basal cell carcinoma of skin, unspecified: Secondary | ICD-10-CM | POA: Insufficient documentation

## 2012-04-07 DIAGNOSIS — R198 Other specified symptoms and signs involving the digestive system and abdomen: Secondary | ICD-10-CM | POA: Insufficient documentation

## 2012-04-07 DIAGNOSIS — E785 Hyperlipidemia, unspecified: Secondary | ICD-10-CM | POA: Insufficient documentation

## 2012-04-07 DIAGNOSIS — C44519 Basal cell carcinoma of skin of other part of trunk: Secondary | ICD-10-CM

## 2012-04-07 DIAGNOSIS — I10 Essential (primary) hypertension: Secondary | ICD-10-CM | POA: Insufficient documentation

## 2012-04-07 DIAGNOSIS — C44510 Basal cell carcinoma of anal skin: Secondary | ICD-10-CM

## 2012-04-07 DIAGNOSIS — Z79899 Other long term (current) drug therapy: Secondary | ICD-10-CM | POA: Insufficient documentation

## 2012-04-07 HISTORY — PX: TRANSANAL EXCISION OF RECTAL MASS: SHX6134

## 2012-04-07 LAB — CBC
MCV: 84.1 fL (ref 78.0–100.0)
Platelets: 246 10*3/uL (ref 150–400)
RBC: 4.85 MIL/uL (ref 4.22–5.81)
RDW: 13 % (ref 11.5–15.5)
WBC: 7.6 10*3/uL (ref 4.0–10.5)

## 2012-04-07 LAB — SURGICAL PCR SCREEN
MRSA, PCR: NEGATIVE
Staphylococcus aureus: NEGATIVE

## 2012-04-07 SURGERY — EXCISION, MASS, RECTUM, ANAL APPROACH
Anesthesia: General | Site: Anus

## 2012-04-07 SURGERY — EXCISION, MASS, RECTUM, ANAL APPROACH
Anesthesia: General | Site: Anus | Wound class: Clean Contaminated

## 2012-04-07 MED ORDER — OXYCODONE HCL 5 MG PO TABS: 5.0000 mg | ORAL_TABLET | ORAL | Status: DC | PRN

## 2012-04-07 MED ORDER — ACETAMINOPHEN 325 MG PO TABS: 650.0000 mg | ORAL_TABLET | ORAL | Status: DC | PRN

## 2012-04-07 MED ORDER — ACETAMINOPHEN 650 MG RE SUPP
650.0000 mg | RECTAL | Status: DC | PRN
  Filled 2012-04-07: qty 1

## 2012-04-07 MED ORDER — ONDANSETRON HCL 4 MG/2ML IJ SOLN
INTRAMUSCULAR | Status: DC | PRN
  Administered 2012-04-07: 4 mg via INTRAVENOUS

## 2012-04-07 MED ORDER — KETOROLAC TROMETHAMINE 30 MG/ML IJ SOLN: 15.0000 mg | Freq: Once | INTRAMUSCULAR | Status: DC | PRN

## 2012-04-07 MED ORDER — ROCURONIUM BROMIDE 100 MG/10ML IV SOLN
INTRAVENOUS | Status: DC | PRN
  Administered 2012-04-07: 30 mg via INTRAVENOUS

## 2012-04-07 MED ORDER — NEOSTIGMINE METHYLSULFATE 1 MG/ML IJ SOLN
INTRAMUSCULAR | Status: DC | PRN
  Administered 2012-04-07: 4 mg via INTRAVENOUS

## 2012-04-07 MED ORDER — LIDOCAINE HCL 2 % EX GEL
CUTANEOUS | Status: AC
  Filled 2012-04-07: qty 10

## 2012-04-07 MED ORDER — PROPOFOL 10 MG/ML IV BOLUS
INTRAVENOUS | Status: DC | PRN
  Administered 2012-04-07: 200 mg via INTRAVENOUS

## 2012-04-07 MED ORDER — LACTATED RINGERS IV SOLN: INTRAVENOUS | Status: DC

## 2012-04-07 MED ORDER — OXYCODONE HCL 5 MG PO TABS
ORAL_TABLET | ORAL | Status: AC
  Filled 2012-04-07: qty 1

## 2012-04-07 MED ORDER — BUPIVACAINE-EPINEPHRINE PF 0.25-1:200000 % IJ SOLN
INTRAMUSCULAR | Status: AC
  Filled 2012-04-07: qty 30

## 2012-04-07 MED ORDER — GLYCOPYRROLATE 0.2 MG/ML IJ SOLN
INTRAMUSCULAR | Status: DC | PRN
  Administered 2012-04-07: .7 mg via INTRAVENOUS

## 2012-04-07 MED ORDER — FENTANYL CITRATE 0.05 MG/ML IJ SOLN
INTRAMUSCULAR | Status: DC | PRN
  Administered 2012-04-07 (×3): 50 ug via INTRAVENOUS
  Administered 2012-04-07: 100 ug via INTRAVENOUS

## 2012-04-07 MED ORDER — 0.9 % SODIUM CHLORIDE (POUR BTL) OPTIME
TOPICAL | Status: DC | PRN
  Administered 2012-04-07: 1000 mL

## 2012-04-07 MED ORDER — DOCUSATE SODIUM 100 MG PO CAPS: 100.0000 mg | ORAL_CAPSULE | Freq: Two times a day (BID) | ORAL | Status: DC

## 2012-04-07 MED ORDER — FENTANYL CITRATE 0.05 MG/ML IJ SOLN
25.0000 ug | INTRAMUSCULAR | Status: DC | PRN
  Administered 2012-04-07 (×2): 50 ug via INTRAVENOUS

## 2012-04-07 MED ORDER — BUPIVACAINE LIPOSOME 1.3 % IJ SUSP
20.0000 mL | Freq: Once | INTRAMUSCULAR | Status: AC
  Administered 2012-04-07: 20 mL
  Filled 2012-04-07: qty 20

## 2012-04-07 MED ORDER — SUCCINYLCHOLINE CHLORIDE 20 MG/ML IJ SOLN
INTRAMUSCULAR | Status: DC | PRN
  Administered 2012-04-07: 100 mg via INTRAVENOUS

## 2012-04-07 MED ORDER — DEXAMETHASONE SODIUM PHOSPHATE 10 MG/ML IJ SOLN
INTRAMUSCULAR | Status: DC | PRN
  Administered 2012-04-07: 10 mg via INTRAVENOUS

## 2012-04-07 MED ORDER — SODIUM CHLORIDE 0.9 % IV SOLN: 250.0000 mL | INTRAVENOUS | Status: DC | PRN

## 2012-04-07 MED ORDER — FENTANYL CITRATE 0.05 MG/ML IJ SOLN
INTRAMUSCULAR | Status: AC
  Filled 2012-04-07: qty 2

## 2012-04-07 MED ORDER — PROMETHAZINE HCL 25 MG/ML IJ SOLN: 6.2500 mg | INTRAMUSCULAR | Status: DC | PRN

## 2012-04-07 MED ORDER — MIDAZOLAM HCL 5 MG/5ML IJ SOLN
INTRAMUSCULAR | Status: DC | PRN
  Administered 2012-04-07: 2 mg via INTRAVENOUS

## 2012-04-07 MED ORDER — LACTATED RINGERS IV SOLN
INTRAVENOUS | Status: DC | PRN
  Administered 2012-04-07 (×2): via INTRAVENOUS

## 2012-04-07 MED ORDER — OXYCODONE HCL 5 MG PO TABS
5.0000 mg | ORAL_TABLET | ORAL | Status: DC | PRN
  Administered 2012-04-07: 5 mg via ORAL

## 2012-04-07 MED ORDER — SODIUM CHLORIDE 0.9 % IJ SOLN: 3.0000 mL | INTRAMUSCULAR | Status: DC | PRN

## 2012-04-07 MED ORDER — DEXAMETHASONE SODIUM PHOSPHATE 10 MG/ML IJ SOLN
INTRAMUSCULAR | Status: AC
  Filled 2012-04-07: qty 1

## 2012-04-07 MED ORDER — ONDANSETRON HCL 4 MG/2ML IJ SOLN: 4.0000 mg | Freq: Four times a day (QID) | INTRAMUSCULAR | Status: DC | PRN

## 2012-04-07 MED ORDER — SODIUM CHLORIDE 0.9 % IJ SOLN: 3.0000 mL | Freq: Two times a day (BID) | INTRAMUSCULAR | Status: DC

## 2012-04-07 MED ORDER — LIDOCAINE HCL (CARDIAC) 20 MG/ML IV SOLN
INTRAVENOUS | Status: DC | PRN
  Administered 2012-04-07: 50 mg via INTRAVENOUS

## 2012-04-07 MED ORDER — MUPIROCIN 2 % EX OINT
TOPICAL_OINTMENT | Freq: Two times a day (BID) | CUTANEOUS | Status: DC
  Filled 2012-04-07: qty 22

## 2012-04-07 SURGICAL SUPPLY — 38 items
BLADE HEX COATED 2.75 (ELECTRODE) ×2 IMPLANT
BLADE SURG 15 STRL LF DISP TIS (BLADE) ×1 IMPLANT
BLADE SURG 15 STRL SS (BLADE) ×2
BRIEF STRETCH FOR OB PAD LRG (UNDERPADS AND DIAPERS) ×1 IMPLANT
CANISTER SUCTION 2500CC (MISCELLANEOUS) ×2 IMPLANT
CLOTH BEACON ORANGE TIMEOUT ST (SAFETY) ×2 IMPLANT
COVER MAYO STAND STRL (DRAPES) ×1 IMPLANT
DECANTER SPIKE VIAL GLASS SM (MISCELLANEOUS) ×1 IMPLANT
DRAPE LG THREE QUARTER DISP (DRAPES) ×1 IMPLANT
DRSG PAD ABDOMINAL 8X10 ST (GAUZE/BANDAGES/DRESSINGS) IMPLANT
ELECT REM PT RETURN 9FT ADLT (ELECTROSURGICAL) ×2
ELECTRODE REM PT RTRN 9FT ADLT (ELECTROSURGICAL) ×1 IMPLANT
GAUZE SPONGE 4X4 16PLY XRAY LF (GAUZE/BANDAGES/DRESSINGS) ×2 IMPLANT
GLOVE BIOGEL PI IND STRL 7.0 (GLOVE) ×1 IMPLANT
GLOVE BIOGEL PI INDICATOR 7.0 (GLOVE) ×1
GOWN PREVENTION PLUS XXLARGE (GOWN DISPOSABLE) ×2 IMPLANT
GOWN STRL NON-REIN LRG LVL3 (GOWN DISPOSABLE) ×2 IMPLANT
GOWN STRL REIN XL XLG (GOWN DISPOSABLE) ×2 IMPLANT
HEMOSTAT SNOW SURGICEL 2X4 (HEMOSTASIS) IMPLANT
KIT BASIN OR (CUSTOM PROCEDURE TRAY) ×2 IMPLANT
LUBRICANT JELLY K Y 4OZ (MISCELLANEOUS) ×2 IMPLANT
NDL HYPO 25X1 1.5 SAFETY (NEEDLE) ×1 IMPLANT
NDL SAFETY ECLIPSE 18X1.5 (NEEDLE) IMPLANT
NEEDLE HYPO 18GX1.5 SHARP (NEEDLE)
NEEDLE HYPO 25X1 1.5 SAFETY (NEEDLE) ×2 IMPLANT
NS IRRIG 1000ML POUR BTL (IV SOLUTION) ×2 IMPLANT
PACK BASIC VI WITH GOWN DISP (CUSTOM PROCEDURE TRAY) ×2 IMPLANT
PACK LITHOTOMY IV (CUSTOM PROCEDURE TRAY) ×1 IMPLANT
PENCIL BUTTON HOLSTER BLD 10FT (ELECTRODE) ×2 IMPLANT
SPONGE GAUZE 4X4 12PLY (GAUZE/BANDAGES/DRESSINGS) ×1 IMPLANT
SPONGE SURGIFOAM ABS GEL 100 (HEMOSTASIS) ×2 IMPLANT
SUT CHROMIC 2 0 SH (SUTURE) IMPLANT
SUT CHROMIC 3 0 SH 27 (SUTURE) ×1 IMPLANT
SUT SILK 3 0 SH CR/8 (SUTURE) ×1 IMPLANT
SUT VIC AB 2-0 SH 18 (SUTURE) ×2 IMPLANT
SYR CONTROL 10ML LL (SYRINGE) ×2 IMPLANT
TOWEL OR 17X26 10 PK STRL BLUE (TOWEL DISPOSABLE) ×3 IMPLANT
YANKAUER SUCT BULB TIP 10FT TU (MISCELLANEOUS) ×2 IMPLANT

## 2012-04-07 NOTE — Transfer of Care (Signed)
Immediate Anesthesia Transfer of Care Note  Patient: Larry Hamilton  Procedure(s) Performed: Procedure(s) (LRB): TRANSANAL EXCISION OF RECTAL MASS (N/A)  Patient Location: PACU  Anesthesia Type: General  Level of Consciousness: sedated, patient cooperative and responds to stimulaton  Airway & Oxygen Therapy: Patient Spontanous Breathing and Patient connected to face mask oxgen  Post-op Assessment: Report given to PACU RN and Post -op Vital signs reviewed and stable  Post vital signs: Reviewed and stable  Complications: No apparent anesthesia complications

## 2012-04-07 NOTE — Op Note (Addendum)
04/07/2012  8:30 AM  PATIENT:  Larry Hamilton  46 y.o. male  Patient Care Team: Toma Deiters, MD as PCP - General (Unknown Physician Specialty)  PRE-OPERATIVE DIAGNOSIS:  basal cell carcinoma, postive margin  POST-OPERATIVE DIAGNOSIS:  basal cell carcinoma  PROCEDURE:  Procedure(s): Wide local excision of anal canal basal cell carcinoma margin  SURGEON:  Surgeon(s): Romie Levee, MD  ASSISTANT: none   ANESTHESIA:   local and general  EBL:   20ml  Delay start of Pharmacological VTE agent (>24hrs) due to surgical blood loss or risk of bleeding:  not applicable  DRAINS: none   SPECIMEN:  Source of Specimen:  anal canal, previous scar excision  DISPOSITION OF SPECIMEN:  PATHOLOGY  COUNTS:  YES  PLAN OF CARE: Discharge to home after PACU  PATIENT DISPOSITION:  PACU - hemodynamically stable.  INDICATION: This is a 46 year old male who recently underwent a wide local excision of a basal cell carcinoma of the anal canal.  his anal canal margin was positive and he presents today for reexcision. The risks and benefits of this procedure were explained to the patient prior to the OR and consent was signed and placed on chart.  OR FINDINGS: healing anal canal scar.  DESCRIPTION: the patient was identified in the preoperative holding area and taken to the OR where he was placed under general anesthesia. He was then laid prone and jackknife position on the operating room table. His buttocks were gently taped apart. He was prepped and draped in the usual sterile fashion. Surgical timeout was performed indicating the correct patient, procedure and positioning. Indices are as noted be in place prior to initiation of anesthesia. After this was completed the previous wide local excision scar was identified. This was elevated with a Allis clamp and incised with a 10 blade scalpel. The anal canal margin was removed. The proximal portion of this was incised and sent to frozen for  identification of any basal cell carcinoma remaining. There was none noted upon frozen section evaluation. The remaining portion of the specimen was sent to pathology for further examination as a permanent specimen. A short stitch was placed in the anal canal margin and a long stitch was placed in the perianal tissue margin. A 3-0 chromic suture was used to close the defect. This was done in a running fashion. Exparel was then infused into the patient's subcutaneous tissues and anal canal for anesthesia. The patient was then awakened from anesthesia and sent to the post anesthesia care unit in stable condition. All counts are correct per OR staff.

## 2012-04-07 NOTE — H&P (View-Only) (Signed)
Chief Complaint  Patient presents with  . Cancer    Anal    HISTORY: Larry Hamilton is a 46 y.o. male who presents to the office with a perianal mass.  Other symptoms include bleeding and itching.  This had been present for 6 yrs.  His bowel habits are regular and his bowel movements are soft.  His fiber intake is moderate.    His last colonoscopy was in 2006 per pt and was neg except for internal hemorrhoids.  Past Medical History  Diagnosis Date  . Bipolar disorder   . Depression   . Panic attack   . Kidney stones   . Hypertension   . Migraine   . Arthritis   . Cancer   . Hyperlipidemia       Past Surgical History  Procedure Date  . Knee surgery   . Mouth surgery         Current Outpatient Prescriptions  Medication Sig Dispense Refill  . ALPRAZolam (XANAX) 1 MG tablet Take 1 mg by mouth 4 (four) times daily.      . atorvastatin (LIPITOR) 40 MG tablet Take 40 mg by mouth daily.      . ibuprofen (ADVIL,MOTRIN) 800 MG tablet Take 800 mg by mouth every 8 (eight) hours as needed. Pain      . QUEtiapine (SEROQUEL) 400 MG tablet Take 400 mg by mouth at bedtime.          Allergies  Allergen Reactions  . Rocephin (Ceftriaxone Sodium In Dextrose)       Family History  Problem Relation Age of Onset  . Cancer Father     Leukemia    History   Social History  . Marital Status: Single    Spouse Name: N/A    Number of Children: N/A  . Years of Education: N/A   Social History Main Topics  . Smoking status: Current Every Day Smoker  . Smokeless tobacco: None  . Alcohol Use: Yes     Comment: occassional  . Drug Use: No  . Sexually Active:    Other Topics Concern  . None   Social History Narrative  . None      REVIEW OF SYSTEMS - PERTINENT POSITIVES ONLY: Review of Systems - General ROS: negative for - chills or fever Hematological and Lymphatic ROS: negative for - bleeding problems or blood clots GI: no bleeding with BM's, pos for  occ abd pain,  epigastric  EXAM: Filed Vitals:   03/18/12 0922  BP: 120/80  Pulse: 90  Temp: 97.6 F (36.4 C)  Resp: 12    General appearance: alert and cooperative Resp: clear to auscultation bilaterally Cardio: regular rate and rhythm GI: soft, non-tender; bowel sounds normal; no masses,  no organomegaly,  Upper abd diastasis, no palpable hernias   Procedure: Anoscopy Surgeon: Malik Ruffino Diagnosis: anal mass  Assistant: Glaspey After the risks and benefits were explained, verbal consent was obtained for above procedure  Anesthesia: none Findings: R ant internal hemorrhoid enlarged, R ant perianal lesion (~4x2cm)   ASSESSMENT AND PLAN: Larry Hamilton is a 46 y.o. M who was referred to me for a basal cell carcinoma of the perianal region.  He also appears to have an enlarged internal hemorrhoid.  We will perform a wide local excision of this mass.  If this does not require too much skin resection, I will proceed with hemorrhoidectomy as well.  I am hopeful this will resole his symptoms.        Kylina Vultaggio C Olevia Westervelt, MD Colon and Rectal Surgery / General Surgery [facility]      Visit Diagnoses: 1. Primary basal cell carcinoma of anus     Primary Care Physician: HASANAJ,XAJE A, MD   

## 2012-04-07 NOTE — Interval H&P Note (Signed)
History and Physical Interval Note:  04/07/2012 7:16 AM  Larry Hamilton  has presented today for surgery, with the diagnosis of basal cell carcinoma, postive margin.  The various methods of treatment have been discussed with the patient and family. After consideration of risks, benefits and other options for treatment, the patient has consented to  Procedure(s) (LRB) with comments: TRANSANAL EXCISION OF RECTAL MASS (N/A) - Wide Local Excision of Perianal Mass as a surgical intervention .  The patient's history has been reviewed, patient examined, no change in status, stable for surgery.  I have reviewed the patient's chart and labs.  Questions were answered to the patient's satisfaction.     Vanita Panda, MD  Colorectal and General Surgery [facility]

## 2012-04-07 NOTE — Anesthesia Preprocedure Evaluation (Signed)
Anesthesia Evaluation  Patient identified by MRN, date of birth, ID band Patient awake    Reviewed: Allergy & Precautions, H&P , NPO status , Patient's Chart, lab work & pertinent test results  History of Anesthesia Complications (+) DIFFICULT AIRWAY  Airway Mallampati: III TM Distance: <3 FB Neck ROM: Limited    Dental No notable dental hx.    Pulmonary neg pulmonary ROS,  breath sounds clear to auscultation  Pulmonary exam normal       Cardiovascular hypertension, Pt. on medications Rhythm:Regular Rate:Normal     Neuro/Psych Bipolar Disorder negative neurological ROS     GI/Hepatic negative GI ROS, Neg liver ROS,   Endo/Other  Morbid obesity  Renal/GU negative Renal ROS  negative genitourinary   Musculoskeletal negative musculoskeletal ROS (+)   Abdominal   Peds negative pediatric ROS (+)  Hematology negative hematology ROS (+)   Anesthesia Other Findings   Reproductive/Obstetrics negative OB ROS                           Anesthesia Physical Anesthesia Plan  ASA: III  Anesthesia Plan: General   Post-op Pain Management:    Induction: Intravenous  Airway Management Planned: Oral ETT and Video Laryngoscope Planned  Additional Equipment:   Intra-op Plan:   Post-operative Plan: Extubation in OR  Informed Consent: I have reviewed the patients History and Physical, chart, labs and discussed the procedure including the risks, benefits and alternatives for the proposed anesthesia with the patient or authorized representative who has indicated his/her understanding and acceptance.   Dental advisory given  Plan Discussed with: CRNA and Surgeon  Anesthesia Plan Comments:         Anesthesia Quick Evaluation

## 2012-04-07 NOTE — Anesthesia Postprocedure Evaluation (Signed)
  Anesthesia Post-op Note  Patient: Larry Hamilton  Procedure(s) Performed: Procedure(s) (LRB): TRANSANAL EXCISION OF RECTAL MASS (N/A)  Patient Location: PACU  Anesthesia Type: General  Level of Consciousness: awake and alert   Airway and Oxygen Therapy: Patient Spontanous Breathing  Post-op Pain: mild  Post-op Assessment: Post-op Vital signs reviewed, Patient's Cardiovascular Status Stable, Respiratory Function Stable, Patent Airway and No signs of Nausea or vomiting  Last Vitals:  Filed Vitals:   04/07/12 0900  BP: 143/94  Pulse: 77  Temp:   Resp: 16    Post-op Vital Signs: stable   Complications: No apparent anesthesia complications

## 2012-04-07 NOTE — Anesthesia Procedure Notes (Addendum)
Procedure Name: Intubation Date/Time: 04/07/2012 7:35 AM Performed by: Paris Lore Pre-anesthesia Checklist: Patient identified, Emergency Drugs available, Suction available, Patient being monitored and Timeout performed Patient Re-evaluated:Patient Re-evaluated prior to inductionOxygen Delivery Method: Circle system utilized Preoxygenation: Pre-oxygenation with 100% oxygen Intubation Type: IV induction Ventilation: Mask ventilation without difficulty Laryngoscope Size: Mac and 4 Grade View: Grade I Tube type: Oral Laser Tube: Cuffed inflated with minimal occlusive pressure - saline Tube size: 7.5 mm Number of attempts: 1 Airway Equipment and Method: Patient positioned with wedge pillow and Video-laryngoscopy (Documented difficult intubation Grade 1 with Glidescope) Placement Confirmation: ETT inserted through vocal cords under direct vision,  positive ETCO2,  CO2 detector and breath sounds checked- equal and bilateral Secured at: 21 cm Tube secured with: Tape Dental Injury: Teeth and Oropharynx as per pre-operative assessment  Difficulty Due To: Difficulty was anticipated Future Recommendations: Recommend- induction with short-acting agent, and alternative techniques readily available

## 2012-04-07 NOTE — Progress Notes (Signed)
Dr. Okey Dupre in- made aware of patient's blood pressures.

## 2012-04-08 ENCOUNTER — Encounter (HOSPITAL_COMMUNITY): Payer: Self-pay | Admitting: General Surgery

## 2012-04-12 ENCOUNTER — Encounter (INDEPENDENT_AMBULATORY_CARE_PROVIDER_SITE_OTHER): Payer: Medicaid Other | Admitting: General Surgery

## 2012-04-28 ENCOUNTER — Ambulatory Visit (INDEPENDENT_AMBULATORY_CARE_PROVIDER_SITE_OTHER): Payer: Medicaid Other | Admitting: General Surgery

## 2012-04-28 ENCOUNTER — Encounter (INDEPENDENT_AMBULATORY_CARE_PROVIDER_SITE_OTHER): Payer: Self-pay | Admitting: General Surgery

## 2012-04-28 VITALS — BP 118/72 | HR 84 | Temp 98.6°F | Resp 14 | Ht 68.0 in | Wt 229.2 lb

## 2012-04-28 DIAGNOSIS — C44519 Basal cell carcinoma of skin of other part of trunk: Secondary | ICD-10-CM

## 2012-04-28 DIAGNOSIS — C44510 Basal cell carcinoma of anal skin: Secondary | ICD-10-CM

## 2012-04-28 NOTE — Patient Instructions (Signed)
Return to office in 3 months for follow up exam

## 2012-04-28 NOTE — Progress Notes (Signed)
Larry Hamilton is a 47 y.o. male who is status post a excision of his basal cell carcinoma of the anus, which had a positive margin.  His scar was removed again a couple weeks later and his path was once again positive.  He does not have any complaints.  He does not have pain with bowel movements.  He denies any bleeding.  He is having regular BM's.  Objective: Filed Vitals:   04/28/12 1524  BP: 118/72  Pulse: 84  Temp: 98.6 F (37 C)  Resp: 14    General appearance: alert and cooperative  Incision: healing well   Assessment: s/p  Patient Active Problem List  Diagnosis  . Primary basal cell carcinoma of anus    Plan: I do not see any reason to take him back to the OR at this time.  I see no residual ulcer.  His skin is healing well.  I will re-examine him in 3 months.  If there is any new ulcer at that time, I will re-excise then.      Vanita Panda, MD [facility], [place] [phone]   04/28/2012 3:34 PM

## 2012-05-05 ENCOUNTER — Encounter (INDEPENDENT_AMBULATORY_CARE_PROVIDER_SITE_OTHER): Payer: Self-pay

## 2012-05-17 ENCOUNTER — Telehealth (INDEPENDENT_AMBULATORY_CARE_PROVIDER_SITE_OTHER): Payer: Self-pay

## 2012-05-17 NOTE — Telephone Encounter (Signed)
I called and let the pt know that per Dr Maisie Fus, there should be nothing left.  She did not see anything concerning on his exam.  We will follow up with him in April.   I told him he can go back to work.  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  He does not need a note.

## 2012-05-17 NOTE — Telephone Encounter (Signed)
Message copied by Ivory Broad on Tue May 17, 2012 10:01 AM ------      Message from: Marin Shutter      Created: Tue May 17, 2012  9:24 AM      Regarding: Dr Glenna Durand: [phone]       Pt called wanting to talk to you or Dr T.  Pt states that he would like to get all his cancer taken out because he wants to go back to work.  Thx

## 2012-07-27 ENCOUNTER — Encounter (INDEPENDENT_AMBULATORY_CARE_PROVIDER_SITE_OTHER): Payer: Medicaid Other | Admitting: General Surgery

## 2012-08-02 ENCOUNTER — Encounter (INDEPENDENT_AMBULATORY_CARE_PROVIDER_SITE_OTHER): Payer: Self-pay | Admitting: *Deleted

## 2012-08-09 ENCOUNTER — Telehealth: Payer: Self-pay

## 2012-08-09 NOTE — Telephone Encounter (Signed)
Pt was referred by Dr. Olena Leatherwood for colonoscopy. LM with mom for a return call.

## 2012-08-10 NOTE — Telephone Encounter (Signed)
Pt said he has problems with indigestion and he wants to get checked out. York Spaniel his family is "dying like flies" from cancer but he is not sure what kind of cancer. He also made his mom an appt today at same time with [facility] due to theire transportation problems.

## 2012-08-29 ENCOUNTER — Ambulatory Visit: Payer: Medicaid Other | Admitting: Gastroenterology

## 2012-08-29 ENCOUNTER — Telehealth: Payer: Self-pay | Admitting: Gastroenterology

## 2012-08-29 NOTE — Telephone Encounter (Signed)
Pt was a no show

## 2012-09-08 ENCOUNTER — Ambulatory Visit: Payer: Medicaid Other | Admitting: Gastroenterology

## 2012-12-27 ENCOUNTER — Emergency Department (HOSPITAL_COMMUNITY)
Admission: EM | Admit: 2012-12-27 | Discharge: 2012-12-27 | Disposition: A | Discharge status: Home / Self Care | Payer: Medicaid Other | Attending: Emergency Medicine | Admitting: Emergency Medicine

## 2012-12-27 ENCOUNTER — Encounter (HOSPITAL_COMMUNITY): Payer: Self-pay

## 2012-12-27 ENCOUNTER — Emergency Department (HOSPITAL_COMMUNITY): Payer: Medicaid Other

## 2012-12-27 DIAGNOSIS — F410 Panic disorder [episodic paroxysmal anxiety] without agoraphobia: Secondary | ICD-10-CM | POA: Insufficient documentation

## 2012-12-27 DIAGNOSIS — R112 Nausea with vomiting, unspecified: Secondary | ICD-10-CM | POA: Insufficient documentation

## 2012-12-27 DIAGNOSIS — F319 Bipolar disorder, unspecified: Secondary | ICD-10-CM | POA: Insufficient documentation

## 2012-12-27 DIAGNOSIS — R21 Rash and other nonspecific skin eruption: Secondary | ICD-10-CM | POA: Insufficient documentation

## 2012-12-27 DIAGNOSIS — R05 Cough: Secondary | ICD-10-CM | POA: Insufficient documentation

## 2012-12-27 DIAGNOSIS — J3489 Other specified disorders of nose and nasal sinuses: Secondary | ICD-10-CM | POA: Insufficient documentation

## 2012-12-27 DIAGNOSIS — Z8739 Personal history of other diseases of the musculoskeletal system and connective tissue: Secondary | ICD-10-CM | POA: Insufficient documentation

## 2012-12-27 DIAGNOSIS — R109 Unspecified abdominal pain: Secondary | ICD-10-CM

## 2012-12-27 DIAGNOSIS — Z85828 Personal history of other malignant neoplasm of skin: Secondary | ICD-10-CM | POA: Insufficient documentation

## 2012-12-27 DIAGNOSIS — E785 Hyperlipidemia, unspecified: Secondary | ICD-10-CM | POA: Insufficient documentation

## 2012-12-27 DIAGNOSIS — M1990 Unspecified osteoarthritis, unspecified site: Secondary | ICD-10-CM | POA: Insufficient documentation

## 2012-12-27 DIAGNOSIS — R51 Headache: Secondary | ICD-10-CM | POA: Insufficient documentation

## 2012-12-27 DIAGNOSIS — Z87828 Personal history of other (healed) physical injury and trauma: Secondary | ICD-10-CM | POA: Insufficient documentation

## 2012-12-27 DIAGNOSIS — Z87891 Personal history of nicotine dependence: Secondary | ICD-10-CM | POA: Insufficient documentation

## 2012-12-27 DIAGNOSIS — Z87442 Personal history of urinary calculi: Secondary | ICD-10-CM | POA: Insufficient documentation

## 2012-12-27 DIAGNOSIS — R059 Cough, unspecified: Secondary | ICD-10-CM | POA: Insufficient documentation

## 2012-12-27 DIAGNOSIS — R1032 Left lower quadrant pain: Secondary | ICD-10-CM | POA: Insufficient documentation

## 2012-12-27 DIAGNOSIS — H538 Other visual disturbances: Secondary | ICD-10-CM | POA: Insufficient documentation

## 2012-12-27 DIAGNOSIS — Z8679 Personal history of other diseases of the circulatory system: Secondary | ICD-10-CM | POA: Insufficient documentation

## 2012-12-27 DIAGNOSIS — Z79899 Other long term (current) drug therapy: Secondary | ICD-10-CM | POA: Insufficient documentation

## 2012-12-27 DIAGNOSIS — R079 Chest pain, unspecified: Secondary | ICD-10-CM

## 2012-12-27 DIAGNOSIS — R0602 Shortness of breath: Secondary | ICD-10-CM | POA: Insufficient documentation

## 2012-12-27 DIAGNOSIS — R1012 Left upper quadrant pain: Secondary | ICD-10-CM | POA: Insufficient documentation

## 2012-12-27 DIAGNOSIS — M542 Cervicalgia: Secondary | ICD-10-CM | POA: Insufficient documentation

## 2012-12-27 LAB — COMPREHENSIVE METABOLIC PANEL
ALT: 33 U/L (ref 0–53)
AST: 18 U/L (ref 0–37)
Albumin: 4.1 g/dL (ref 3.5–5.2)
Alkaline Phosphatase: 63 U/L (ref 39–117)
Glucose, Bld: 111 mg/dL — ABNORMAL HIGH (ref 70–99)
Potassium: 4.1 mEq/L (ref 3.5–5.1)
Sodium: 139 mEq/L (ref 135–145)
Total Protein: 7.3 g/dL (ref 6.0–8.3)

## 2012-12-27 LAB — CBC WITH DIFFERENTIAL/PLATELET
Basophils Relative: 0 % (ref 0–1)
Eosinophils Absolute: 0.2 10*3/uL (ref 0.0–0.7)
Lymphs Abs: 1.9 10*3/uL (ref 0.7–4.0)
MCH: 29.5 pg (ref 26.0–34.0)
Neutrophils Relative %: 69 % (ref 43–77)
Platelets: 215 10*3/uL (ref 150–400)
RBC: 5.11 MIL/uL (ref 4.22–5.81)

## 2012-12-27 LAB — URINALYSIS, ROUTINE W REFLEX MICROSCOPIC
Bilirubin Urine: NEGATIVE
Glucose, UA: NEGATIVE mg/dL
Hgb urine dipstick: NEGATIVE
Specific Gravity, Urine: >1.03 — ABNORMAL HIGH (ref 1.005–1.030)
pH: 6 (ref 5.0–8.0)

## 2012-12-27 MED ORDER — SODIUM CHLORIDE 0.9 % IV SOLN: INTRAVENOUS | Status: DC

## 2012-12-27 MED ORDER — TRAMADOL HCL 50 MG PO TABS: 50.0000 mg | ORAL_TABLET | Freq: Four times a day (QID) | ORAL | Status: DC | PRN

## 2012-12-27 MED ORDER — HYDROMORPHONE HCL PF 1 MG/ML IJ SOLN
1.0000 mg | Freq: Once | INTRAMUSCULAR | Status: AC
  Administered 2012-12-27: 1 mg via INTRAVENOUS
  Filled 2012-12-27: qty 1

## 2012-12-27 MED ORDER — SODIUM CHLORIDE 0.9 % IV BOLUS (SEPSIS)
250.0000 mL | Freq: Once | INTRAVENOUS | Status: AC
  Administered 2012-12-27: 1000 mL via INTRAVENOUS

## 2012-12-27 MED ORDER — ONDANSETRON HCL 4 MG/2ML IJ SOLN
4.0000 mg | Freq: Once | INTRAMUSCULAR | Status: AC
  Administered 2012-12-27: 4 mg via INTRAVENOUS
  Filled 2012-12-27: qty 2

## 2012-12-27 NOTE — ED Notes (Signed)
Pt c/o cp that is sharp and located in central to left chest that radiates to back underneath left shoulderblade.  Has occurred over the last 2 1/2 days, states he has nausea and vomiting, no diarrhea.     Pt states that he also has had a cough and runny nose the last 2 week.  Also states that the pain is all down his left side or flank, on the LLQ in his abdomen, and radiates to left testicle.    Does not remember the last time he urinated.

## 2012-12-27 NOTE — ED Notes (Signed)
Pt c/o chest pain and SOB x 2 days.  Pt says has had cough and runny nose for past few days.  Pt's chest tender to palpate under left rib but pain is not affected by movement.  Pt also c/o pain in left testicle.  Reports 2 nights ago left side of body went numb.  Pt also c/o headache.
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
## 2013-02-15 ENCOUNTER — Encounter: Payer: Self-pay | Admitting: Gastroenterology

## 2013-02-15 ENCOUNTER — Ambulatory Visit (INDEPENDENT_AMBULATORY_CARE_PROVIDER_SITE_OTHER): Payer: Medicaid Other | Admitting: Gastroenterology

## 2013-02-15 ENCOUNTER — Encounter (INDEPENDENT_AMBULATORY_CARE_PROVIDER_SITE_OTHER): Payer: Self-pay

## 2013-02-15 VITALS — BP 127/75 | HR 98 | Temp 98.0°F | Wt 233.2 lb

## 2013-02-15 DIAGNOSIS — R1310 Dysphagia, unspecified: Secondary | ICD-10-CM

## 2013-02-15 DIAGNOSIS — Z800 Family history of malignant neoplasm of digestive organs: Secondary | ICD-10-CM

## 2013-02-15 HISTORY — DX: Dysphagia, unspecified: R13.10

## 2013-02-15 HISTORY — DX: Family history of malignant neoplasm of digestive organs: Z80.0

## 2013-02-15 MED ORDER — PEG-KCL-NACL-NASULF-NA ASC-C 100 G PO SOLR: 1.0000 | ORAL | Status: DC

## 2013-02-15 NOTE — Patient Instructions (Signed)
We have scheduled you for a colonoscopy and upper endoscopy with dilation with Dr. Jena Gauss in the near future.   Further recommendations to follow.
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
## 2013-02-20 ENCOUNTER — Encounter: Payer: Self-pay | Admitting: Gastroenterology

## 2013-02-20 NOTE — Assessment & Plan Note (Signed)
Father diagnosed in his 10s. Last colonoscopy in 2006 by Dr. Linna Darner normal. Low-volume hematochezia likely benign, but with his family history, needs closer interval screenings. Also of note, Dec 2013 underwent excision of basal cell carcinoma of anus.   Proceed with TCS with Dr. Jena Gauss in near future: the risks, benefits, and alternatives have been discussed with the patient in detail. The patient states understanding and desires to proceed. Phenergan 25 mg IV on call due to polypharmacy

## 2013-02-20 NOTE — Assessment & Plan Note (Addendum)
47 year old male with solid food and pill dysphagia in the setting of what appears to be uncontrolled GERD. No prior EGD. LLQ/LUQ pain noted as "constant", unclear if a contributor to current symptomatology at this point. Likely dealing with chronic abdominal pain. Concern for web, ring, or stricture. Persistent nausea and vomiting with unclear etiology but likely secondary to significant GERD symptoms. It should be noted that patient is on several NSAIDs; unable to rule out NSAID-induced injury.  Proceed with EGD/ED with Dr. Jena Gauss in near future. Risks and benefits discussed with stated understanding. Utilize Phenergan 25 mg IV on call due to polypharmacy.  PATIENT DECLINED PPI. Wants to wait until after EGD.

## 2013-02-21 ENCOUNTER — Encounter (HOSPITAL_COMMUNITY): Payer: Self-pay | Admitting: Pharmacy Technician

## 2013-02-21 NOTE — Progress Notes (Signed)
cc'd to pcp 

## 2013-03-06 ENCOUNTER — Encounter (HOSPITAL_COMMUNITY): Payer: Self-pay | Admitting: *Deleted

## 2013-03-06 ENCOUNTER — Ambulatory Visit (HOSPITAL_COMMUNITY)
Admission: RE | Admit: 2013-03-06 | Discharge: 2013-03-06 | Disposition: A | Discharge status: Home / Self Care | Payer: Medicaid Other | Source: Ambulatory Visit | Attending: Internal Medicine | Admitting: Internal Medicine

## 2013-03-06 ENCOUNTER — Encounter (HOSPITAL_COMMUNITY)
Admission: RE | Disposition: A | Discharge status: Home / Self Care | Payer: Self-pay | Source: Ambulatory Visit | Attending: Internal Medicine

## 2013-03-06 DIAGNOSIS — R1310 Dysphagia, unspecified: Secondary | ICD-10-CM

## 2013-03-06 DIAGNOSIS — K2960 Other gastritis without bleeding: Secondary | ICD-10-CM

## 2013-03-06 DIAGNOSIS — K921 Melena: Secondary | ICD-10-CM

## 2013-03-06 DIAGNOSIS — Z800 Family history of malignant neoplasm of digestive organs: Secondary | ICD-10-CM

## 2013-03-06 DIAGNOSIS — K2100 Gastro-esophageal reflux disease with esophagitis, without bleeding: Secondary | ICD-10-CM | POA: Insufficient documentation

## 2013-03-06 DIAGNOSIS — K319 Disease of stomach and duodenum, unspecified: Secondary | ICD-10-CM | POA: Insufficient documentation

## 2013-03-06 DIAGNOSIS — K5289 Other specified noninfective gastroenteritis and colitis: Secondary | ICD-10-CM | POA: Insufficient documentation

## 2013-03-06 DIAGNOSIS — K648 Other hemorrhoids: Secondary | ICD-10-CM | POA: Insufficient documentation

## 2013-03-06 DIAGNOSIS — I10 Essential (primary) hypertension: Secondary | ICD-10-CM | POA: Insufficient documentation

## 2013-03-06 HISTORY — PX: COLONOSCOPY: SHX5424

## 2013-03-06 HISTORY — PX: ESOPHAGOGASTRODUODENOSCOPY (EGD) WITH ESOPHAGEAL DILATION: SHX5812

## 2013-03-06 SURGERY — COLONOSCOPY
Anesthesia: Moderate Sedation

## 2013-03-06 MED ORDER — MEPERIDINE HCL 100 MG/ML IJ SOLN
INTRAMUSCULAR | Status: DC | PRN
  Administered 2013-03-06: 50 mg via INTRAVENOUS
  Administered 2013-03-06 (×2): 25 mg via INTRAVENOUS

## 2013-03-06 MED ORDER — SODIUM CHLORIDE 0.9 % IJ SOLN
INTRAMUSCULAR | Status: AC
  Filled 2013-03-06: qty 10

## 2013-03-06 MED ORDER — PROMETHAZINE HCL 25 MG/ML IJ SOLN
25.0000 mg | Freq: Once | INTRAMUSCULAR | Status: AC
  Administered 2013-03-06: 25 mg via INTRAVENOUS

## 2013-03-06 MED ORDER — STERILE WATER FOR IRRIGATION IR SOLN
Status: DC | PRN
  Administered 2013-03-06: 11:00:00

## 2013-03-06 MED ORDER — MIDAZOLAM HCL 5 MG/5ML IJ SOLN
INTRAMUSCULAR | Status: AC
  Filled 2013-03-06: qty 10

## 2013-03-06 MED ORDER — BUTAMBEN-TETRACAINE-BENZOCAINE 2-2-14 % EX AERO
INHALATION_SPRAY | CUTANEOUS | Status: DC | PRN
  Administered 2013-03-06: 2 via TOPICAL

## 2013-03-06 MED ORDER — ONDANSETRON HCL 4 MG/2ML IJ SOLN
INTRAMUSCULAR | Status: AC
  Filled 2013-03-06: qty 2

## 2013-03-06 MED ORDER — SODIUM CHLORIDE 0.9 % IV SOLN
INTRAVENOUS | Status: DC
  Administered 2013-03-06: 1000 mL via INTRAVENOUS

## 2013-03-06 MED ORDER — PROMETHAZINE HCL 25 MG/ML IJ SOLN
INTRAMUSCULAR | Status: AC
  Filled 2013-03-06: qty 1

## 2013-03-06 MED ORDER — ONDANSETRON HCL 4 MG/2ML IJ SOLN
INTRAMUSCULAR | Status: DC | PRN
  Administered 2013-03-06: 4 mg via INTRAVENOUS

## 2013-03-06 MED ORDER — MEPERIDINE HCL 100 MG/ML IJ SOLN
INTRAMUSCULAR | Status: AC
  Filled 2013-03-06: qty 2

## 2013-03-06 MED ORDER — MIDAZOLAM HCL 5 MG/5ML IJ SOLN
INTRAMUSCULAR | Status: DC | PRN
  Administered 2013-03-06: 2 mg via INTRAVENOUS
  Administered 2013-03-06: 1 mg via INTRAVENOUS
  Administered 2013-03-06: 2 mg via INTRAVENOUS

## 2013-03-06 NOTE — H&P (View-Only) (Signed)
Primary Care Physician:  Toma Deiters, MD Primary Gastroenterologist:  Dr. Jena Gauss  Chief Complaint  Patient presents with  . Dysphagia    HPI:  Larry Hamilton is a 47 year old male who presents today at the request of Dr. Olena Leatherwood secondary to dysphagia, abdominal pain, nausea, and vomiting. He reports LUQ/LLQ abdominal pain, chronic for 10 years. Noted as constant. Vomiting for 2 years. Awakens at night with reflux. Nausea worse in the morning and at night. Feels something "caught" in his throat. Solid food and pill dysphagia, chronic. Denies constipation or diarrhea. Occasional low-volume hematochezia with last colonoscopy in 2006 normal by Dr. Linna Darner. Does not want to start Prilosec for GERD. No prior EGD.   Past Medical History  Diagnosis Date  . Bipolar disorder     [facility]  . Depression   . Kidney stones   . Migraine   . Arthritis   . Hyperlipidemia   . Anxiety disorder CHEST PAIN  . Panic attacks   . Basal cell carcinoma of anal skin   . Internal hemorrhoid   . Rectum pain   . Nasal sinus congestion   . 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 Hypertension     denies  . Schizophrenia     Past Surgical History  Procedure Laterality Date  . Knee surgery    . Mouth surgery    . Left knee surgery  AGE 40  . Teeth extractions and excision mouth lesion  AGE 47  . Appendectomy  AGE 65  . Rectal biopsy  03/21/2012    Procedure: BIOPSY RECTAL;  Surgeon: Romie Levee, MD;  Location: [facility];  Service: General;  Laterality: N/A;  WIDE LOCAL EXCISON OF ANAL MASS, POSSIBLE HEMORRHOIDECTOMY  . Hemorrhoid surgery  03/21/2012    Procedure: HEMORRHOIDECTOMY;  Surgeon: Romie Levee, MD;  Location: [facility];  Service: General;  Laterality: N/A;  . Transanal excision of rectal mass  04/07/2012    Procedure: TRANSANAL EXCISION OF RECTAL MASS;  Surgeon: Romie Levee, MD;  Location: [facility];  Service: General;  Laterality: N/A;  Wide Local Excision of Perianal Mass  . Colonoscopy  2006    Dr. Linna Darner: normal colonoscopy.    Current Outpatient Prescriptions  Medication Sig Dispense Refill  . acetaminophen (TYLENOL) 325 MG tablet Take 975 mg by mouth every 6 (six) hours as needed for pain.      Marland Kitchen ALPRAZolam (XANAX) 1 MG tablet Take 1 mg by mouth 4 (four) times daily.      Marland Kitchen atorvastatin (LIPITOR) 40 MG tablet Take 40 mg by mouth every evening.       . citalopram (CELEXA) 20 MG tablet Take 20 mg by mouth daily.      . diclofenac (VOLTAREN) 75 MG EC tablet Take 75 mg by mouth 2 (two) times daily.      Marland Kitchen ibuprofen (ADVIL,MOTRIN) 800 MG tablet Take 800 mg by mouth every 8 (eight) hours as needed. Pain      . QUEtiapine (SEROQUEL) 400 MG tablet Take 800 mg by mouth at bedtime.       . peg 3350 powder (MOVIPREP) 100 G SOLR Take 1 kit (200 g total) by mouth as directed.  1 kit  0   No current facility-administered medications for this visit.    Allergies as of 02/15/2013 - Review Complete 02/15/2013  Allergen Reaction Noted  . Rocephin [ceftriaxone sodium in dextrose]  Anaphylaxis 12/20/2011    Family History  Problem Relation Age of Onset  . Cancer Father     Leukemia  . Colon cancer Father     14s    History   Social History  . Marital Status: Single    Spouse Name: N/A    Number of Children: N/A  . Years of Education: N/A   Occupational History  . Not on file.   Social History Main Topics  . Smoking status: Former Smoker    Types: Cigarettes  . Smokeless tobacco: Current User    Types: Snuff, Chew     Comment: DIP AND CHEW TOBACCO FOR 40 YRS/ SMOKING UNTIL AGE 39 APPROX. >5 YRS  (PER PT DENIES SMOKING AT THIS TIME)  . Alcohol Use: Yes     Comment: occ  . Drug Use: No  . Sexual Activity: Not on file   Other Topics Concern  . Not on file   Social History Narrative  . No narrative on file    Review of Systems: Gen: Denies any fever, chills, fatigue,  weight loss, lack of appetite.  CV: Denies chest pain, heart palpitations, peripheral edema, syncope.  Resp: +DOE GI: See HPI GU : Denies urinary burning, urinary frequency, urinary hesitancy MS: +joint pain Derm: Denies rash, itching, dry skin Psych: Denies depression, anxiety, memory loss, and confusion Heme: Denies bruising, bleeding, and enlarged lymph nodes.  Physical Exam: BP 127/75  Pulse 98  Temp(Src) 98 F (36.7 C) (Oral)  Wt 233 lb 3.2 oz (105.779 kg) General:   Alert and oriented. Pleasant and cooperative. Well-nourished and well-developed.  Head:  Normocephalic and atraumatic. Eyes:  Without icterus, sclera clear and conjunctiva pink.  Ears:  Normal auditory acuity. Nose:  No deformity, discharge,  or lesions. Mouth:  No deformity or lesions, oral mucosa pink.  Neck:  Supple, without mass or thyromegaly. Lungs:  Clear to auscultation bilaterally. No wheezes, rales, or rhonchi. No distress.  Heart:  S1, S2 present without murmurs appreciated.  Abdomen:  +BS, soft, largely obese with large AP diameter. Difficult to assess HSM. non-tender and non-distended. Rectal:  Deferred  Msk:  Symmetrical without gross deformities. Normal posture. Extremities:  Without clubbing or edema. Neurologic:  Alert and  oriented x4;  grossly normal neurologically. Skin:  Intact without significant lesions or rashes. Cervical Nodes:  No significant cervical adenopathy. Psych:  Alert and cooperative. Normal mood and affect.

## 2013-03-06 NOTE — Interval H&P Note (Signed)
History and Physical Interval Note:  03/06/2013 10:43 AM  Larry Hamilton  has presented today for surgery, with the diagnosis of NAUSEA AND VOMITING AND DYSPHAGIA  The various methods of treatment have been discussed with the patient and family. After consideration of risks, benefits and other options for treatment, the patient has consented to  Procedure(s) with comments: COLONOSCOPY (N/A) - 11:00 ESOPHAGOGASTRODUODENOSCOPY (EGD) WITH ESOPHAGEAL DILATION (N/A) as a surgical intervention .  The patient's history has been reviewed, patient examined, no change in status, stable for surgery.  I have reviewed the patient's chart and labs.  Questions were answered to the patient's satisfaction.     EGD with possible esophageal dilation and colonoscopy per plan.The risks, benefits, limitations, imponderables and alternatives regarding both EGD and colonoscopy have been reviewed with the patient. Questions have been answered. All parties agreeable.    Eula Listen

## 2013-03-06 NOTE — Op Note (Signed)
[facility] [address] [place], [address]   COLONOSCOPY PROCEDURE REPORT  PATIENT: Larry Hamilton, Larry Hamilton  MR#:         [id number] BIRTHDATE: 05-19-1965 , 47  yrs. old GENDER: Male ENDOSCOPIST: R.  Roetta Sessions, MD FACP FACG REFERRED BY:  Germain Osgood, M.D. PROCEDURE DATE:  03/06/2013 PROCEDURE:     Ileocolonoscopy-diagnostic  INDICATIONS: Hematochezia  INFORMED CONSENT:  The risks, benefits, alternatives and imponderables including but not limited to bleeding, perforation as well as the possibility of a missed lesion have been reviewed.  The potential for biopsy, lesion removal, etc. have also been discussed.  Questions have been answered.  All parties agreeable. Please see the history and physical in the medical record for more information.  MEDICATIONS: Versed 5 mg IV and Demerol 100 mg IV in divided doses. Phenergan 25 mg IV. Zofran 4 mg IV  DESCRIPTION OF PROCEDURE:  After a digital rectal exam was performed, the EG-2990i ([id number]) and EC-3890Li ([id number]) colonoscope was advanced from the anus through the rectum and colon to the area of the cecum, ileocecal valve and appendiceal orifice. The cecum was deeply intubated.  These structures were well-seen and photographed for the record.  From the level of the cecum and ileocecal valve, the scope was slowly and cautiously withdrawn. The mucosal surfaces were carefully surveyed utilizing scope tip deflection to facilitate fold flattening as needed.  The scope was pulled down into the rectum where a thorough examination including retroflexion was performed.    FINDINGS:  Adequate preparation. Friable anal canal hemorrhoids; otherwise, normal rectum.  normal. colonic mucosa.  Normal distal 10 cm of terminal ileal mucosa  THERAPEUTIC / DIAGNOSTIC MANEUVERS PERFORMED:  None  COMPLICATIONS: None  CECAL WITHDRAWAL TIME:  10 minutes  IMPRESSION:  Friable hemorrhoids; otherwise negative colonoscopy. Recurrent  hematochezia in the setting of known hemorrhoids patient is failed to had any long-standing improvement with intermittent topical hydrocortisone therapy.  You likely benefit from hemorrhoidal banding  RECOMMENDATIONS: High risk repeat colonoscopy in 5 years. Will schedule in-office hemorrhoid banding session in the near future.  See EGD report   _______________________________ eSigned:  R. Roetta Sessions, MD FACP [facility] 03/06/2013 11:30 AM   CC:

## 2013-03-06 NOTE — Op Note (Signed)
[facility] [address] [place], [address]   ENDOSCOPY PROCEDURE REPORT  PATIENT: Larry Hamilton, Larry Hamilton  MR#: [id number] BIRTHDATE: 1965/11/27 , 47  yrs. old GENDER: Male ENDOSCOPIST: R.  Roetta Sessions, MD FACP FACG REFERRED BY:  Germain Osgood, M.D. PROCEDURE DATE:  03/06/2013 PROCEDURE:     EGD with Elease Hashimoto dilation followed by gastric biopsy  INDICATIONS:      GERD/esophageal dysphagia.  INFORMED CONSENT:   The risks, benefits, limitations, alternatives and imponderables have been discussed.  The potential for biopsy, esophogeal dilation, etc. have also been reviewed.  Questions have been answered.  All parties agreeable.  Please see the history and physical in the medical record for more information.  MEDICATIONS:Versed 4 mg IV and Demerol 75 mg IV in divided doses. Phenergan 25 mg IV. Zofran 4 mg IV. Cetacaine spray.  DESCRIPTION OF PROCEDURE:   The EG-2990i ([id number])  endoscope was introduced through the mouth and advanced to the second portion of the duodenum without difficulty or limitations.  The mucosal surfaces were surveyed very carefully during advancement of the scope and upon withdrawal.  Retroflexion view of the proximal stomach and esophagogastric junction was performed.      FINDINGS:  "skipped" distal esophageal erosions and small areas of ulceration coming up 5 cm from the GE junction. No Barrett's esophagus. No stricture or ring seen. Stomach empty. Antral erosions. No ulcer or infiltrating process. Patent pylorus. Normal first and second portion of the duodenum  THERAPEUTIC / DIAGNOSTIC MANEUVERS PERFORMED:  A 56 French Maloney dilator was passed to full insertion easily. A look back revealed no apparent complication related to this maneuver. Subsequently, biopsies of antrum taken for histologic study.`   COMPLICATIONS:  None  IMPRESSION:      Erosive/ulcerative reflux esophagitis.  Status post passage of a Maloney dilator. Antral  erosions-status post biopsy  RECOMMENDATIONS:  Follow up on pathology. Begin Dexilant 60 mg daily.  See colonoscopy report    _______________________________ R. Roetta Sessions, MD FACP [facility] eSigned:  R. Roetta Sessions, MD FACP [facility] 03/06/2013 11:09 AM     CC:  PATIENT NAME:  Johaan, Ryser MR#: [id number]

## 2013-03-12 ENCOUNTER — Encounter: Payer: Self-pay | Admitting: Internal Medicine

## 2013-03-13 ENCOUNTER — Telehealth: Payer: Self-pay

## 2013-03-13 NOTE — Telephone Encounter (Signed)
Cc PCP 

## 2013-03-13 NOTE — Telephone Encounter (Signed)
LM with patient's mother that patient has OV with RMR for a banding on 12/16 at 915

## 2013-03-13 NOTE — Telephone Encounter (Signed)
Letter from: Corbin Ade  Reason for Letter: Results Review  Send letter to patient.  Send copy of letter with path to referring provider and PCP.  Raynelle Fanning' pt should be scheduled for hemorrhoid banding soon

## 2013-03-13 NOTE — Telephone Encounter (Signed)
Letter mailed to pt.  

## 2013-03-14 ENCOUNTER — Encounter (HOSPITAL_COMMUNITY): Payer: Self-pay | Admitting: Internal Medicine

## 2013-03-15 ENCOUNTER — Telehealth: Payer: Self-pay | Admitting: Internal Medicine

## 2013-03-15 NOTE — Telephone Encounter (Signed)
Results have been mailed to pt

## 2013-03-15 NOTE — Telephone Encounter (Signed)
Please call patient with his results at [phone]. He said if he wasn't there to tell his mother or his brother.

## 2013-03-28 ENCOUNTER — Ambulatory Visit: Payer: Medicaid Other | Admitting: Internal Medicine

## 2013-03-28 ENCOUNTER — Encounter: Payer: Self-pay | Admitting: Internal Medicine

## 2013-03-28 VITALS — BP 137/82 | HR 106 | Temp 98.7°F | Ht 68.0 in | Wt 234.8 lb

## 2013-05-05 ENCOUNTER — Encounter: Payer: Medicaid Other | Admitting: Internal Medicine

## 2013-05-05 ENCOUNTER — Telehealth: Payer: Self-pay | Admitting: Internal Medicine

## 2013-05-05 NOTE — Telephone Encounter (Signed)
Pt was a no show

## 2013-06-28 ENCOUNTER — Encounter (HOSPITAL_COMMUNITY): Payer: Self-pay | Admitting: *Deleted

## 2013-06-28 ENCOUNTER — Emergency Department (HOSPITAL_COMMUNITY)
Admission: EM | Admit: 2013-06-28 | Discharge: 2013-06-28 | Disposition: A | Discharge status: Home / Self Care | Payer: Medicaid Other | Attending: Emergency Medicine | Admitting: Emergency Medicine

## 2013-06-28 DIAGNOSIS — F1210 Cannabis abuse, uncomplicated: Secondary | ICD-10-CM | POA: Insufficient documentation

## 2013-06-28 DIAGNOSIS — Z8679 Personal history of other diseases of the circulatory system: Secondary | ICD-10-CM | POA: Insufficient documentation

## 2013-06-28 DIAGNOSIS — F319 Bipolar disorder, unspecified: Secondary | ICD-10-CM | POA: Insufficient documentation

## 2013-06-28 DIAGNOSIS — R0789 Other chest pain: Secondary | ICD-10-CM

## 2013-06-28 DIAGNOSIS — F1410 Cocaine abuse, uncomplicated: Secondary | ICD-10-CM | POA: Insufficient documentation

## 2013-06-28 DIAGNOSIS — Z791 Long term (current) use of non-steroidal anti-inflammatories (NSAID): Secondary | ICD-10-CM | POA: Insufficient documentation

## 2013-06-28 DIAGNOSIS — Z87891 Personal history of nicotine dependence: Secondary | ICD-10-CM | POA: Insufficient documentation

## 2013-06-28 DIAGNOSIS — I10 Essential (primary) hypertension: Secondary | ICD-10-CM | POA: Insufficient documentation

## 2013-06-28 DIAGNOSIS — Z85828 Personal history of other malignant neoplasm of skin: Secondary | ICD-10-CM | POA: Insufficient documentation

## 2013-06-28 DIAGNOSIS — Z87828 Personal history of other (healed) physical injury and trauma: Secondary | ICD-10-CM | POA: Insufficient documentation

## 2013-06-28 DIAGNOSIS — E785 Hyperlipidemia, unspecified: Secondary | ICD-10-CM | POA: Insufficient documentation

## 2013-06-28 DIAGNOSIS — Z79899 Other long term (current) drug therapy: Secondary | ICD-10-CM | POA: Insufficient documentation

## 2013-06-28 DIAGNOSIS — F209 Schizophrenia, unspecified: Secondary | ICD-10-CM | POA: Insufficient documentation

## 2013-06-28 DIAGNOSIS — G43909 Migraine, unspecified, not intractable, without status migrainosus: Secondary | ICD-10-CM | POA: Insufficient documentation

## 2013-06-28 DIAGNOSIS — M129 Arthropathy, unspecified: Secondary | ICD-10-CM | POA: Insufficient documentation

## 2013-06-28 DIAGNOSIS — Z87442 Personal history of urinary calculi: Secondary | ICD-10-CM | POA: Insufficient documentation

## 2013-06-28 DIAGNOSIS — R45851 Suicidal ideations: Secondary | ICD-10-CM

## 2013-06-28 LAB — COMPREHENSIVE METABOLIC PANEL
ALT: 40 U/L (ref 0–53)
AST: 23 U/L (ref 0–37)
Albumin: 4.4 g/dL (ref 3.5–5.2)
Alkaline Phosphatase: 64 U/L (ref 39–117)
BUN: 13 mg/dL (ref 6–23)
CO2: 25 mEq/L (ref 19–32)
Calcium: 9.8 mg/dL (ref 8.4–10.5)
Chloride: 104 mEq/L (ref 96–112)
Creatinine, Ser: 1 mg/dL (ref 0.50–1.35)
GFR calc Af Amer: >90 mL/min (ref >90)
GFR calc non Af Amer: 89 mL/min — ABNORMAL LOW (ref >90)
Glucose, Bld: 117 mg/dL — ABNORMAL HIGH (ref 70–99)
POTASSIUM: 4.3 meq/L (ref 3.7–5.3)
SODIUM: 143 meq/L (ref 137–147)
TOTAL PROTEIN: 7.8 g/dL (ref 6.0–8.3)
Total Bilirubin: 0.4 mg/dL (ref 0.3–1.2)

## 2013-06-28 LAB — RAPID URINE DRUG SCREEN, HOSP PERFORMED
AMPHETAMINES: NOT DETECTED
Barbiturates: NOT DETECTED
Benzodiazepines: POSITIVE — AB
Cocaine: POSITIVE — AB
Opiates: NOT DETECTED
Tetrahydrocannabinol: NOT DETECTED

## 2013-06-28 LAB — CBC WITH DIFFERENTIAL/PLATELET
BASOS PCT: 0 % (ref 0–1)
Basophils Absolute: 0 10*3/uL (ref 0.0–0.1)
Eosinophils Absolute: 0.1 10*3/uL (ref 0.0–0.7)
Eosinophils Relative: 1 % (ref 0–5)
HCT: 43.4 % (ref 39.0–52.0)
Hemoglobin: 15 g/dL (ref 13.0–17.0)
Lymphocytes Relative: 17 % (ref 12–46)
Lymphs Abs: 1.4 10*3/uL (ref 0.7–4.0)
MCH: 30.1 pg (ref 26.0–34.0)
MCHC: 34.6 g/dL (ref 30.0–36.0)
MCV: 87 fL (ref 78.0–100.0)
Monocytes Absolute: 0.4 10*3/uL (ref 0.1–1.0)
Monocytes Relative: 5 % (ref 3–12)
NEUTROS PCT: 77 % (ref 43–77)
Neutro Abs: 6.4 10*3/uL (ref 1.7–7.7)
PLATELETS: 238 10*3/uL (ref 150–400)
RBC: 4.99 MIL/uL (ref 4.22–5.81)
RDW: 13 % (ref 11.5–15.5)
WBC: 8.3 10*3/uL (ref 4.0–10.5)

## 2013-06-28 LAB — TROPONIN I

## 2013-06-28 LAB — ETHANOL

## 2013-06-28 NOTE — ED Provider Notes (Signed)
CSN: [id number]     Arrival date & time 06/28/13  0031 History  This chart was scribed for Veryl Speak, MD by Jenne Campus, ED Scribe. This patient was seen in room APA17/APA17 and the patient's care was started at 12:37 AM.   Chief Complaint  Patient presents with  . Anxiety     The history is provided by the patient. No language interpreter was used.    HPI Comments: Larry Hamilton is a 48 y.o. male who presents to the Emergency Department by ambulance complaining of gradually improving CP caused by panic attacks after doing cocaine and smoking marijuana this afternoon. He denies any prior MI, stent placement or other cardiac problems.  He denies frequent drug use and states that he is seen at [facility] for his anxiety. He is currently on Seroquel 800 mg daily, 1 mg x3 daily Xanax and Celexa 20 mg daily and denies any missed doses. He reports that he stays depressed "all the time" but denies any SI. He admits that he needs more help with his anxiety management than [facility] is offering.  He reports prior admissions for psychiatric problems. Last admission was 2 weeks ago at [facility].  He denies any other sxs currently but requests to talk to a therapist.    Past Medical History  Diagnosis Date  . Bipolar disorder     [facility]  . Depression   . Kidney stones   . Migraine   . Arthritis   . Hyperlipidemia   . Anxiety disorder CHEST PAIN  . Panic attacks   . Basal cell carcinoma of anal skin   . Internal hemorrhoid   . Rectum pain   . Nasal sinus congestion   . 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 Hypertension     denies  . Schizophrenia    Past Surgical History  Procedure Laterality Date  . Knee surgery    . Mouth surgery    . Left knee surgery  AGE 65  . Teeth extractions and excision mouth lesion  AGE 76  . Appendectomy  AGE 23  . Rectal biopsy  03/21/2012    Procedure: BIOPSY RECTAL;  Surgeon:  Leighton Ruff, MD;  Location: [place];  Service: General;  Laterality: N/A;  WIDE LOCAL EXCISON OF ANAL MASS, POSSIBLE HEMORRHOIDECTOMY  . Hemorrhoid surgery  03/21/2012    Procedure: HEMORRHOIDECTOMY;  Surgeon: Leighton Ruff, MD;  Location[facility];  Service: General;  Laterality: N/A;  . Transanal excision of rectal mass  04/07/2012    Procedure: TRANSANAL EXCISION OF RECTAL MASS;  Surgeon: Leighton Ruff, MD;  Location: [facility];  Service: General;  Laterality: N/A;  Wide Local Excision of Perianal Mass  . Colonoscopy  2006    Dr. Rowe Pavy: normal colonoscopy.  . Colonoscopy N/A 03/06/2013    Dr. Gala Romney- friable hemorrhoids;o/w negative colonoscopy  . Esophagogastroduodenoscopy (egd) with esophageal dilation N/A 03/06/2013    Dr. Gala Romney- erosive/ulcerative reflux esophagitis. reactive gastopathy with mild chronic inflamation on bx.   Family History  Problem Relation Age of Onset  . Cancer Father     Leukemia  . Colon cancer Father     52s   History  Substance Use Topics  . Smoking status: Former Smoker    Types: Cigarettes  . Smokeless tobacco: Current User    Types: Snuff, Chew     Comment: DIP AND CHEW TOBACCO FOR  40 YRS/ SMOKING UNTIL AGE 36 APPROX. >5 YRS  (PER PT DENIES SMOKING AT THIS TIME)  . Alcohol Use: Yes     Comment: occ    Review of Systems  A complete 10 system review of systems was obtained and all systems are negative except as noted in the HPI and PMH.    Allergies  Rocephin  Home Medications   Current Outpatient Rx  Name  Route  Sig  Dispense  Refill  . acetaminophen (TYLENOL) 325 MG tablet   Oral   Take 975 mg by mouth every 6 (six) hours as needed for pain.         Marland Kitchen ALPRAZolam (XANAX) 1 MG tablet   Oral   Take 1 mg by mouth 4 (four) times daily.         Marland Kitchen aspirin 500 MG tablet   Oral   Take 500 mg by mouth every 6 (six) hours as needed for pain.         Marland Kitchen atorvastatin (LIPITOR) 40 MG tablet   Oral    Take 40 mg by mouth every evening.          . citalopram (CELEXA) 20 MG tablet   Oral   Take 20 mg by mouth daily.         . diclofenac (VOLTAREN) 75 MG EC tablet   Oral   Take 75 mg by mouth 2 (two) times daily.         Marland Kitchen ibuprofen (ADVIL,MOTRIN) 800 MG tablet   Oral   Take 800 mg by mouth every 8 (eight) hours as needed. Pain         . QUEtiapine (SEROQUEL) 400 MG tablet   Oral   Take 800 mg by mouth at bedtime.           Triage Vitals: BP 149/97  Pulse 97  Temp(Src) 98.1 F (36.7 C) (Oral)  Resp 20  Ht 5\' 8"  (1.727 m)  Wt 230 lb (104.327 kg)  BMI 34.98 kg/m2  SpO2 96%  Physical Exam  Nursing note and vitals reviewed. Constitutional: He is oriented to person, place, and time. He appears well-developed and well-nourished. No distress.  HENT:  Head: Normocephalic and atraumatic.  Eyes: Conjunctivae and EOM are normal.  Neck: Normal range of motion. Neck supple. No tracheal deviation present.  Cardiovascular: Normal rate, regular rhythm and normal heart sounds.   No murmur heard. Pulmonary/Chest: Effort normal and breath sounds normal. No respiratory distress. He has no wheezes. He has no rales.  Abdominal: Soft. Bowel sounds are normal. There is no tenderness.  Musculoskeletal: Normal range of motion. He exhibits no edema.  Neurological: He is alert and oriented to person, place, and time. No cranial nerve deficit.  Skin: Skin is warm and dry.  Psychiatric: His speech is normal and behavior is normal. Thought content normal. Cognition and memory are normal. He expresses impulsivity (somewhat). He exhibits a depressed mood.    ED Course  Procedures (including critical care time)  DIAGNOSTIC STUDIES: Oxygen Saturation is 96% on RA, adequate by my interpretation.    COORDINATION OF CARE: 12:43 AM-Discussed treatment plan which includes medical clearance and tele psych with pt at bedside and pt agreed to plan.   Labs Review Labs Reviewed - No data to  display Imaging Review No results found.   EKG Interpretation   Date/Time:  Wednesday June 28 2013 00:55:26 EDT Ventricular Rate:  93 PR Interval:  158 QRS Duration: 92 QT Interval:  380 QTC Calculation: 472 R Axis:   33 Text Interpretation:  Normal sinus rhythm Normal ECG When compared with  ECG of 27-Dec-2012 16:07, No significant change was found Confirmed by  DELOS  MD, Saralee Bolick ([id number]) on 06/28/2013 3:05:02 AM      MDM   Final diagnoses:  None   Assessment completed by TTS. They have determined he is no longer suicidal and would like to go home. His medical workup is unremarkable and I believe this is an appropriate course of action. He has a Social worker and psychiatrist with which to followup once he is discharged.   I personally performed the services described in this documentation, which was scribed in my presence. The recorded information has been reviewed and is accurate.        Veryl Speak, MD 06/28/13 [phone]

## 2013-06-28 NOTE — ED Notes (Signed)
Patient states that he does not want to live, has thought about hurting self but knows he will go to hell if he does. Patient states that he looks after his mom. Takes her where she needs to go. Helps to look after his daughter and her mother. States that he does not have a job and does not have any friends.

## 2013-06-28 NOTE — ED Notes (Signed)
Pt called ems for "chest pain" and when they arrived, pt admitted that he had used cocaine and marijuana just prior and was "freaking out" having panic attack/anxiety.  Pt then told ems that he had thought about suicide.

## 2013-06-28 NOTE — ED Notes (Signed)
Psych consult done at this time.

## 2013-06-28 NOTE — ED Notes (Addendum)
Patient wanded by security. 

## 2013-06-28 NOTE — BH Assessment (Addendum)
Tele Assessment Note   Larry Hamilton is a 48 y.o. male who initially presented to [facility] with chest pains.  Pt admitted to EMS that he had been using cocaine and marijuana before they arrived.  Pt told medical staff that he was SI but has no intent or plan to harm self.  Pt states he will go the hell if he harms himself.  Pt has no past SI attempts, he has SI thoughts "off and on" for several years.  Pt denies Hi/AVH to this Probation officer.  Pt has outpatient services with Dr. Jeanell Sparrow for medication mgt.  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  Pt reports stressors:(1) caregiver for his mother;(2) relational issues with brother and child's mother and (3) pressures from other family members.  Pt has been using cocaine for 3 mos, weekly.  Pt says amount varies.  Pt is also using 1 joint weekly.  Pt says his case mgr--Sara Idalia Needle with [facility] is securing a therapist for pt. This Probation officer discussed interview with Dr. Stark Jock and he agreed with writer to d/c pt home with this mother.  Pt contracted for safety.      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 Bipolar disorder     [facility]  . Depression   . Kidney stones   . Migraine   . Arthritis   . Hyperlipidemia   . Anxiety disorder CHEST PAIN  . Panic attacks   . Basal cell carcinoma of anal skin   . Internal hemorrhoid   . Rectum pain   . Nasal sinus congestion   . 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 Hypertension     denies  . Schizophrenia    Axis IV: other psychosocial or environmental problems, problems related to social environment and problems with primary support group Axis V: 41-50 serious symptoms  Past Medical History:  Past Medical  History  Diagnosis Date  . Bipolar disorder     [facility]  . Depression   . Kidney stones   . Migraine   . Arthritis   . Hyperlipidemia   . Anxiety disorder CHEST PAIN  . Panic attacks   . Basal cell carcinoma of anal skin   . Internal hemorrhoid   . Rectum pain   . Nasal sinus congestion   . 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 Hypertension     denies  . Schizophrenia     Past Surgical History  Procedure Laterality Date  . Knee surgery    . Mouth surgery    . Left knee surgery  AGE 57  . Teeth extractions and excision mouth lesion  AGE 88  . Appendectomy  AGE 55  . Rectal biopsy  03/21/2012    Procedure: BIOPSY RECTAL;  Surgeon: Leighton Ruff, MD;  Location: [place];  Service: General;  Laterality: N/A;  WIDE LOCAL EXCISON OF ANAL MASS, POSSIBLE HEMORRHOIDECTOMY  . Hemorrhoid surgery  03/21/2012    Procedure: HEMORRHOIDECTOMY;  Surgeon: Leighton Ruff, MD;  Location[facility];  Service: General;  Laterality: N/A;  . Transanal excision of rectal mass  04/07/2012  Procedure: TRANSANAL EXCISION OF RECTAL MASS;  Surgeon: Leighton Ruff, MD;  Location: [facility];  Service: General;  Laterality: N/A;  Wide Local Excision of Perianal Mass  . Colonoscopy  2006    Dr. Rowe Pavy: normal colonoscopy.  . Colonoscopy N/A 03/06/2013    Dr. Gala Romney- friable hemorrhoids;o/w negative colonoscopy  . Esophagogastroduodenoscopy (egd) with esophageal dilation N/A 03/06/2013    Dr. Gala Romney- erosive/ulcerative reflux esophagitis. reactive gastopathy with mild chronic inflamation on bx.    Family History:  Family History  Problem Relation Age of Onset  . Cancer Father     Leukemia  . Colon cancer Father     20s    Social History:  reports that he has quit smoking. His smoking use included Cigarettes. He smoked 0.00 packs per day. His smokeless tobacco use includes Snuff and Chew. He reports  that he drinks alcohol. He reports that he uses illicit drugs (Cocaine and Marijuana).  Additional Social History:  Alcohol / Drug Use Pain Medications: See MAR  Prescriptions: See MAR  Over the Counter: See MAR  History of alcohol / drug use?: Yes Longest period of sobriety (when/how long): None  Negative Consequences of Use: Work / School;Personal relationships;Financial Withdrawal Symptoms: Other (Comment) (No current w/d sxs ) Substance #1 Name of Substance 1: Cocaine  1 - Age of First Use: 49 YOM  1 - Amount (size/oz): Varies  1 - Frequency: Wkly  1 - Duration: 3 Mos  1 - Last Use / Amount: 06/27/13 Substance #2 Name of Substance 2: THC  2 - Age of First Use: Teens  2 - Amount (size/oz): 1 "Joint" 2 - Frequency: Varies  2 - Duration: On-going  2 - Last Use / Amount: 2 Days Ago--no thc in UDS   CIWA: CIWA-Ar BP: 149/97 mmHg Pulse Rate: 97 COWS:    Allergies:  Allergies  Allergen Reactions  . Rocephin [Ceftriaxone Sodium In Dextrose] Anaphylaxis    THROAT AND TONGUE    Home Medications:  (Not in a hospital admission)  OB/GYN Status:  No LMP for male patient.  General Assessment Data Location of Assessment: [facility] Is this a Tele or Face-to-Face Assessment?: Tele Assessment Is this an Initial Assessment or a Re-assessment for this encounter?: Initial Assessment Living Arrangements: Parent (Lives with mother ) Can pt return to current living arrangement?: Yes Admission Status: Voluntary Is patient capable of signing voluntary admission?: Yes Transfer from: [facility] Referral Source: MD  Medical Screening Exam ([place]) Medical Exam completed: No Reason for MSE not completed: Other: (None )  [facility] Crisis Care Plan Living Arrangements: Parent (Lives with mother ) Name of Psychiatrist: Dr. Jeanell Sparrow  Name of Therapist: None   Education Status Is patient currently in school?: No Current Grade: None  Highest grade of school patient has completed: None   Name of school: None  Contact person: None   Risk to self Suicidal Ideation: No-Not Currently/Within Last 6 Months Suicidal Intent: No-Not Currently/Within Last 6 Months Is patient at risk for suicide?: No Suicidal Plan?: No Access to Means: Yes Specify Access to Suicidal Means: Sharps, Pills  What has been your use of drugs/alcohol within the last 12 months?: Abusing: Cocaine, THC  Previous Attempts/Gestures: No How many times?: 0 Other Self Harm Risks: None  Triggers for Past Attempts: None known Intentional Self Injurious Behavior: None Family Suicide History: No Recent stressful life event(s): Conflict (Comment);Other (Comment);Financial Problems (Issues w/brother; Relational issues with child's mother) Persecutory voices/beliefs?: No Depression: Yes Depression Symptoms: Isolating;Loss  of interest in usual pleasures;Feeling worthless/self pity;Feeling angry/irritable Substance abuse history and/or treatment for substance abuse?: Yes Suicide prevention information given to non-admitted patients: Not applicable  Risk to Others Homicidal Ideation: No Thoughts of Harm to Others: No Current Homicidal Intent: No Current Homicidal Plan: No Access to Homicidal Means: No Identified Victim: None  History of harm to others?: No Assessment of Violence: None Noted Violent Behavior Description: None  Does patient have access to weapons?: No Criminal Charges Pending?: No Does patient have a court date: No  Psychosis Hallucinations: None noted Delusions: None noted  Mental Status Report Appear/Hygiene: Disheveled Eye Contact: Good Motor Activity: Unremarkable Speech: Logical/coherent;Soft Level of Consciousness: Alert Mood: Depressed;Anxious Affect: Depressed;Anxious Anxiety Level: Minimal Thought Processes: Coherent;Relevant Judgement: Unimpaired Orientation: Person;Place;Time;Situation Obsessive Compulsive Thoughts/Behaviors: None  Cognitive Functioning Concentration:  Decreased Memory: Recent Intact;Remote Intact IQ: Average Insight: Fair Impulse Control: Fair Appetite: Good Weight Loss: 0 Weight Gain: 0 Sleep: Decreased Total Hours of Sleep: 4 Vegetative Symptoms: None  ADLScreening [facility] Assessment Services) Patient's cognitive ability adequate to safely complete daily activities?: Yes Patient able to express need for assistance with ADLs?: Yes Independently performs ADLs?: Yes (appropriate for developmental age)  Prior Inpatient Therapy Prior Inpatient Therapy: Yes Prior Therapy Dates: 2542,7062 Prior Therapy Facilty/Provider(s): [place]  Reason for Treatment: Mental health   Prior Outpatient Therapy Prior Outpatient Therapy: Yes Prior Therapy Dates: Current  Prior Therapy Facilty/Provider(s): Dr Jeanell Sparrow  Reason for Treatment: Med Mgt   ADL Screening (condition at time of admission) Patient's cognitive ability adequate to safely complete daily activities?: Yes Is the patient deaf or have difficulty hearing?: No Does the patient have difficulty seeing, even when wearing glasses/contacts?: No Does the patient have difficulty concentrating, remembering, or making decisions?: No Patient able to express need for assistance with ADLs?: Yes Does the patient have difficulty dressing or bathing?: No Independently performs ADLs?: Yes (appropriate for developmental age) Does the patient have difficulty walking or climbing stairs?: No Weakness of Legs: None Weakness of Arms/Hands: None  Home Assistive Devices/Equipment Home Assistive Devices/Equipment: None  Therapy Consults (therapy consults require a physician order) PT Evaluation Needed: No OT Evalulation Needed: No SLP Evaluation Needed: No Abuse/Neglect Assessment (Assessment to be complete while patient is alone) Physical Abuse: Denies Verbal Abuse: Denies Sexual Abuse: Denies Exploitation of patient/patient's resources: Denies Self-Neglect: Denies Values /  Beliefs Cultural Requests During Hospitalization: None Spiritual Requests During Hospitalization: None Consults Spiritual Care Consult Needed: No Social Work Consult Needed: No Regulatory affairs officer (For Healthcare) Advance Directive: Patient does not have advance directive;Patient would not like information Pre-existing out of facility DNR order (yellow form or pink MOST form): No Nutrition Screen- [facility] Patient's home diet: Regular  Additional Information 1:1 In Past 12 Months?: No CIRT Risk: No Elopement Risk: No Does patient have medical clearance?: Yes     Disposition:  Disposition Initial Assessment Completed for this Encounter: Yes Disposition of Patient: Other dispositions (D/C to current provider ) Other disposition(s): Other (Comment) (D/C to current provider )  Girtha Rm 06/28/2013 5:11 AM

## 2013-06-28 NOTE — Discharge Instructions (Signed)
Chest Pain (Nonspecific) °It is often hard to give a specific diagnosis for the cause of chest pain. There is always a chance that your pain could be related to something serious, such as a heart attack or a blood clot in the lungs. You need to follow up with your caregiver for further evaluation. °CAUSES  °· Heartburn. °· Pneumonia or bronchitis. °· Anxiety or stress. °· Inflammation around your heart (pericarditis) or lung (pleuritis or pleurisy). °· A blood clot in the lung. °· A collapsed lung (pneumothorax). It can develop suddenly on its own (spontaneous pneumothorax) or from injury (trauma) to the chest. °· Shingles infection (herpes zoster virus). °The chest wall is composed of bones, muscles, and cartilage. Any of these can be the source of the pain. °· The bones can be bruised by injury. °· The muscles or cartilage can be strained by coughing or overwork. °· The cartilage can be affected by inflammation and become sore (costochondritis). °DIAGNOSIS  °Lab tests or other studies, such as X-rays, electrocardiography, stress testing, or cardiac imaging, may be needed to find the cause of your pain.  °TREATMENT  °· Treatment depends on what may be causing your chest pain. Treatment may include: °· Acid blockers for heartburn. °· Anti-inflammatory medicine. °· Pain medicine for inflammatory conditions. °· Antibiotics if an infection is present. °· You may be advised to change lifestyle habits. This includes stopping smoking and avoiding alcohol, caffeine, and chocolate. °· You may be advised to keep your head raised (elevated) when sleeping. This reduces the chance of acid going backward from your stomach into your esophagus. °· Most of the time, nonspecific chest pain will improve within 2 to 3 days with rest and mild pain medicine. °HOME CARE INSTRUCTIONS  °· If antibiotics were prescribed, take your antibiotics as directed. Finish them even if you start to feel better. °· For the next few days, avoid physical  activities that bring on chest pain. Continue physical activities as directed. °· Do not smoke. °· Avoid drinking alcohol. °· Only take over-the-counter or prescription medicine for pain, discomfort, or fever as directed by your caregiver. °· Follow your caregiver's suggestions for further testing if your chest pain does not go away. °· Keep any follow-up appointments you made. If you do not go to an appointment, you could develop lasting (chronic) problems with pain. If there is any problem keeping an appointment, you must call to reschedule. °SEEK MEDICAL CARE IF:  °· You think you are having problems from the medicine you are taking. Read your medicine instructions carefully. °· Your chest pain does not go away, even after treatment. °· You develop a rash with blisters on your chest. °SEEK IMMEDIATE MEDICAL CARE IF:  °· You have increased chest pain or pain that spreads to your arm, neck, jaw, back, or abdomen. °· You develop shortness of breath, an increasing cough, or you are coughing up blood. °· You have severe back or abdominal pain, feel nauseous, or vomit. °· You develop severe weakness, fainting, or chills. °· You have a fever. °THIS IS AN EMERGENCY. Do not wait to see if the pain will go away. Get medical help at once. Call your local emergency services (911 in U.[place] Do not drive yourself to the hospital. °MAKE SURE YOU:  °· Understand these instructions. °· Will watch your condition. °· Will get help right away if you are not doing well or get worse. °Document Released: 01/07/2005 Document Revised: 06/22/2011 Document Reviewed: 11/03/2007 °ExitCare® Patient Information ©2014 ExitCare,   LLC.  Suicidal Feelings, How to Help Yourself Everyone feels sad or unhappy at times, but depressing thoughts and feelings of hopelessness can lead to thoughts of suicide. It can seem as if life is too tough to handle. If you feel as though you have reached the point where suicide is the only answer, it is time to let  someone know immediately.  HOW TO COPE AND PREVENT SUICIDE  Let family, friends, teachers, or counselors know. Get help. Try not to isolate yourself from those who care about you. Even though you may not feel sociable, talk with someone every day. It is best if it is face-to-face. Remember, they will want to help you.  Eat a regularly spaced and well-balanced diet.  Get plenty of rest.  Avoid alcohol and drugs because they will only make you feel worse and may also lower your inhibitions. Remove them from the home. If you are thinking of taking an overdose of your prescribed medicines, give your medicines to someone who can give them to you one day at a time. If you are on antidepressants, let your caregiver know of your feelings so he or she can provide a safer medicine, if that is a concern.  Remove weapons or poisons from your home.  Try to stick to routines. Follow a schedule and remind yourself that you have to keep that schedule every day.  Set some realistic goals and achieve them. Make a list and cross things off as you go. Accomplishments give a sense of worth. Wait until you are feeling better before doing things you find difficult or unpleasant to do.  If you are able, try to start exercising. Even half-hour periods of exercise each day will make you feel better. Getting out in the sun or into nature helps you recover from depression faster. If you have a favorite place to walk, take advantage of that.  Increase safe activities that have always given you pleasure. This may include playing your favorite music, reading a good book, painting a picture, or playing your favorite instrument. Do whatever takes your mind off your depression.  Keep your living space well-lighted. GET HELP Contact a suicide hotline, crisis center, or local suicide prevention center for help right away. Local centers may include a hospital, clinic, community service organization, social service provider, or  health department.  Call your local emergency services (911 in the [place]).  Call a suicide hotline:  1-800-273-TALK (1-(631) 742-2372) in the [place].  1-800-SUICIDE 740-553-0898) in the [place].  517-851-8417 in the [place] for Spanish-speaking counselors.  U1166179 (810) 528-9823) in the [place] for TTY users.  Visit the following websites for information and help:  National Suicide Prevention Lifeline: www.suicidepreventionlifeline.org  Hopeline: www.hopeline.[place] for Suicide Prevention: PromotionalLoans.co.za  For lesbian, gay, bisexual, transgender, or questioning youth, contact [employer]:  R7974166 (862)806-5847) in the [place].  www.thetrevorproject.org  In [place], treatment resources are listed in each [place] with listings available under USAA for [employer] or similar titles. Another source for Crisis Centres by [place] is located at http://www.suicideprevention.ca/in-crisis-now/find-a-crisis-centre-now/crisis-centres Document Released: 10/04/2002 Document Revised: 06/22/2011 Document Reviewed: 02/22/2007 [facility] Patient Information 2014 [place], [place].    Emergency Department Resource Guide 1) Find a Doctor and Pay Out of Pocket Although you won't have to find out who is covered by your insurance plan, it is a good idea to ask around and get recommendations. You will then need to call the office and see if the doctor  you have chosen will accept you as a new patient and what types of options they offer for patients who are self-pay. Some doctors offer discounts or will set up payment plans for their patients who do not have insurance, but you will need to ask so you aren't surprised when you get to your appointment.  2) Contact Your Local Health Department Not all health departments have doctors that can see patients for sick visits, but many do, so it is worth a call to see  if yours does. If you don't know where your local health department is, you can check in your phone book. The CDC also has a tool to help you locate your state's health department, and many state websites also have listings of all of their local health departments.  3) Find a [facility] If your illness is not likely to be very severe or complicated, you may want to try a walk in clinic. These are popping up all over the country in pharmacies, drugstores, and shopping centers. They're usually staffed by nurse practitioners or physician assistants that have been trained to treat common illnesses and complaints. They're usually fairly quick and inexpensive. However, if you have serious medical issues or chronic medical problems, these are probably not your best option.  No Primary Care Doctor: - Call Health Connect at  [phone] - they can help you locate a primary care doctor that  accepts your insurance, provides certain services, etc. - Physician Referral Service- [phone]  Chronic Pain Problems: Organization         Address  Phone   Notes  [facility]  [phone] Patients need to be referred by their primary care doctor.   Medication Assistance: Organization         Address  Phone   Notes  [facility] Medication [facility] [place], [place] [address] [phone] --Must be a resident of [facility] -- Must have NO insurance coverage whatsoever (no Medicaid/ Medicare, etc.) -- The pt. MUST have a primary care doctor that directs their care regularly and follows them in the community   MedAssist  [phone]   [employer]  [phone]    Agencies that provide inexpensive medical care: Organization         Address  Phone   Notes  [place]  [phone]   [facility]    [phone]   [facility] [place], [place] [address] [phone]   [place] [address] [place] [phone]   Planned Parenthood    [phone]   [facility]    [phone]   [place] and [place] [address], [place] Phone:  [phone], Fax:  [phone] Hours of Operation:  9 am - 6 pm, M-F.  Also accepts Medicaid/Medicare and self-pay.  [facility] for [place] [address], [address], [place] Phone: [phone], Fax: [phone]. Hours of Operation:  8:30 am - 5:30 pm, M-F.  Also accepts Medicaid and self-pay.  [facility] [place] [address] [place] Phone: [phone]   [place], [place], [place] [phone], Ext. [phone] Mondays & Thursdays: 7-9 AM.  First 15 patients are seen on a first come, first serve basis.    [place] Providers:  Organization  Address  Phone   Notes  [facility] [address], [place] [phone] Also accepts self-pay patients.  [facility] [id number] [place], [place]  [phone]   [place], [address], [place] [phone]   [facility] [address] [place] [phone]   Lucianne Lei [address], [place]   [phone] Only accepts [place] Access [place] patients after they have their name applied to their card.   Self-Pay (no insurance) in [facility]:  Organization         Address  Phone   Notes  Sickle Cell Patients, [facility] [place] [phone]   [facility] Urgent Care [place] [phone]   [facility] [place]  [place], [place], [place] [phone]   [facility]/Dr. Osei-Bonsu  [address], [place] or [address], [place] [phone] Phone number for both [place] and [place] locations is the same.  Urgent Medical and [facility] [address], [place] [phone]   [facility] [address] [place] or [address] [phone]   [facility] [address], [place] [phone], phone; [phone], fax Sees patients 1st and 3rd Saturday of every month.  Must not qualify for public or private insurance (i.e. Medicaid, Medicare, [place] Health Choice, Veterans' Benefits)  Household income should be no more than 200% of the poverty level The clinic cannot treat you if you are pregnant or think you are pregnant  Sexually transmitted diseases are not treated at the clinic.    Dental Care: Organization         Address  Phone  Notes  [facility] Department of [facility] [place] [phone] Accepts children up to age 47 who are enrolled in [place] or [place]; pregnant women with a Medicaid card; and children who have applied for Medicaid or [place] Health Choice, but were declined, whose parents can pay a reduced fee at time of service.  [facility] Department of [facility]  [address], [place] [phone] Accepts children up to age 35 who are enrolled in [place] or [place]; pregnant women with a Medicaid card; and children who have applied for Medicaid or [place] Health Choice, but were declined, whose parents can pay a reduced fee at time of service.  [place] Adult Dental Access PROGRAM  [place] [phone] Patients are seen by appointment only. Walk-ins are not accepted. [place] will see patients 65 years of age and older. Monday - Tuesday (8am-5pm) Most Wednesdays (8:30-5pm) $30 per visit, cash only  [facility] Adult Dental Access PROGRAM  [address], [facility] [phone] Patients are  seen by appointment only. Walk-ins are not accepted. [place] will see patients 70 years of age and older. One Wednesday Evening (Monthly: Volunteer Based).  $30 per visit, cash only  [place]  [phone] for adults; Children under age 53, call Graduate Pediatric Dentistry at [phone]. Children aged 69-14, please call [phone] to request a pediatric application.  Dental services are provided in all areas of dental care including  fillings, crowns and bridges, complete and partial dentures, implants, gum treatment, root canals, and extractions. Preventive care is also provided. Treatment is provided to both adults and children. Patients are selected via a lottery and there is often a waiting list.   [facility] [address] [place]  [phone] [email or web address]   [facility] [address] [place], [place] [phone], Ext. [phone] Second and Fourth Thursday of each month, opens at 6:30 AM; Clinic ends at 9 AM.  Patients are seen on a first-come first-served basis, and a limited number are seen during each clinic.   [facility]  [address] Hillard Danker [place], [place] [phone]   Eligibility Requirements You must have lived in [place], [place], or [place] counties for at least the last three months.   You cannot be eligible for state or federal sponsored [employer], including [employer], [place], or [employer].   You generally cannot be eligible for healthcare insurance through your employer.    How to apply: Eligibility screenings are held every Tuesday and Wednesday afternoon from 1:00 pm until 4:00 pm. You do not need an appointment for the interview!  [facility] [address], [place], [place]   [place]  [place] Department  [place]  [phone]     Behavioral Health Resources in the Community: Intensive Outpatient Programs Organization         Address  Phone  Notes  [place]. [address] [place], [place] [phone]   [facility] Outpatient [address], [place], [place]   ADS: Alcohol & Drug Svcs [address], [place], [place]   [place] [address]  [place], [place] or [phone]   Substance Abuse Resources Organization         Address  Phone  Notes  Alcohol and Drug Services  [phone]   [place]  [phone]   [facility] [place]   Chinita Pester  [phone]   Residential & Outpatient Substance Abuse Program  [phone]   Psychological Services Organization         Address  Phone  Notes  [facility]  [place]  [phone]   [place] [address], [place] or [phone]    Mobile Crisis Teams Organization         Address  Phone  Notes  [facility], Mobile Crisis Care Unit  [phone]   [facility]  [address] [place], [place]   [place] [address], [place] [phone]    Self-Help/Support Groups Organization         Address  Phone             Notes  [place]. of [place] - variety of support groups  [place] Call for more information  Narcotics Anonymous (NA), [facility] [address], [employer] [place]  2 meetings at this location   Special educational needs teacher         Address  Phone  Notes  [facility]idential [facility] [place],    [place]  [phone]   [facility]  [address] [place] [phone], [place], [place]   [place], [place]  [place] [phone] Admissions: 8am-3pm M-F  Incentives  Substance [place] [address],    [place], [place] [phone]   [facility] [address] [place], [place], [place]   [facility] [place] [facility] [address]  [place], [place]   [facility] - Intensive Outpatient [place] [address] Kristeen [address], [place], [place]   [facility] ([place].,  [place], [place] [phone] or [phone]   [facility]idential Treatment Services (RTS) [address] [place], [place] Accepts Medicaid  [facility] [place] [address],  [place] [phone] Substance Abuse/Addiction Treatment   [facility] Organization         Address  Phone  Notes  [facility]  [phone]   Domenic Schwab, PhD [address] Arlis Porta [place], [place]   [phone] or [phone]   [place], [place] [phone]   [facility] [address] [place], [place] [phone] Insurance/Medicaid/sponsorship through [facility] and Families [address] [place]                                    [place], [place] [phone] [place] [address], [place] [phone]    Dr. Adele Schilder  [phone]   [facility] Dept. 1) [address] [place] 2) [place] 3)  [place] [address], [place] [phone]  [phone]   [place] [phone] or [phone] (After Hours)

## 2013-11-07 IMAGING — CR DG CHEST 2V
2 series · 2 of 2 positions shown · non-contrast
Comparison: 12/20/2011

CLINICAL DATA: Chest pain

CHEST - 2 VIEW

[view not recorded (1 of 2)]
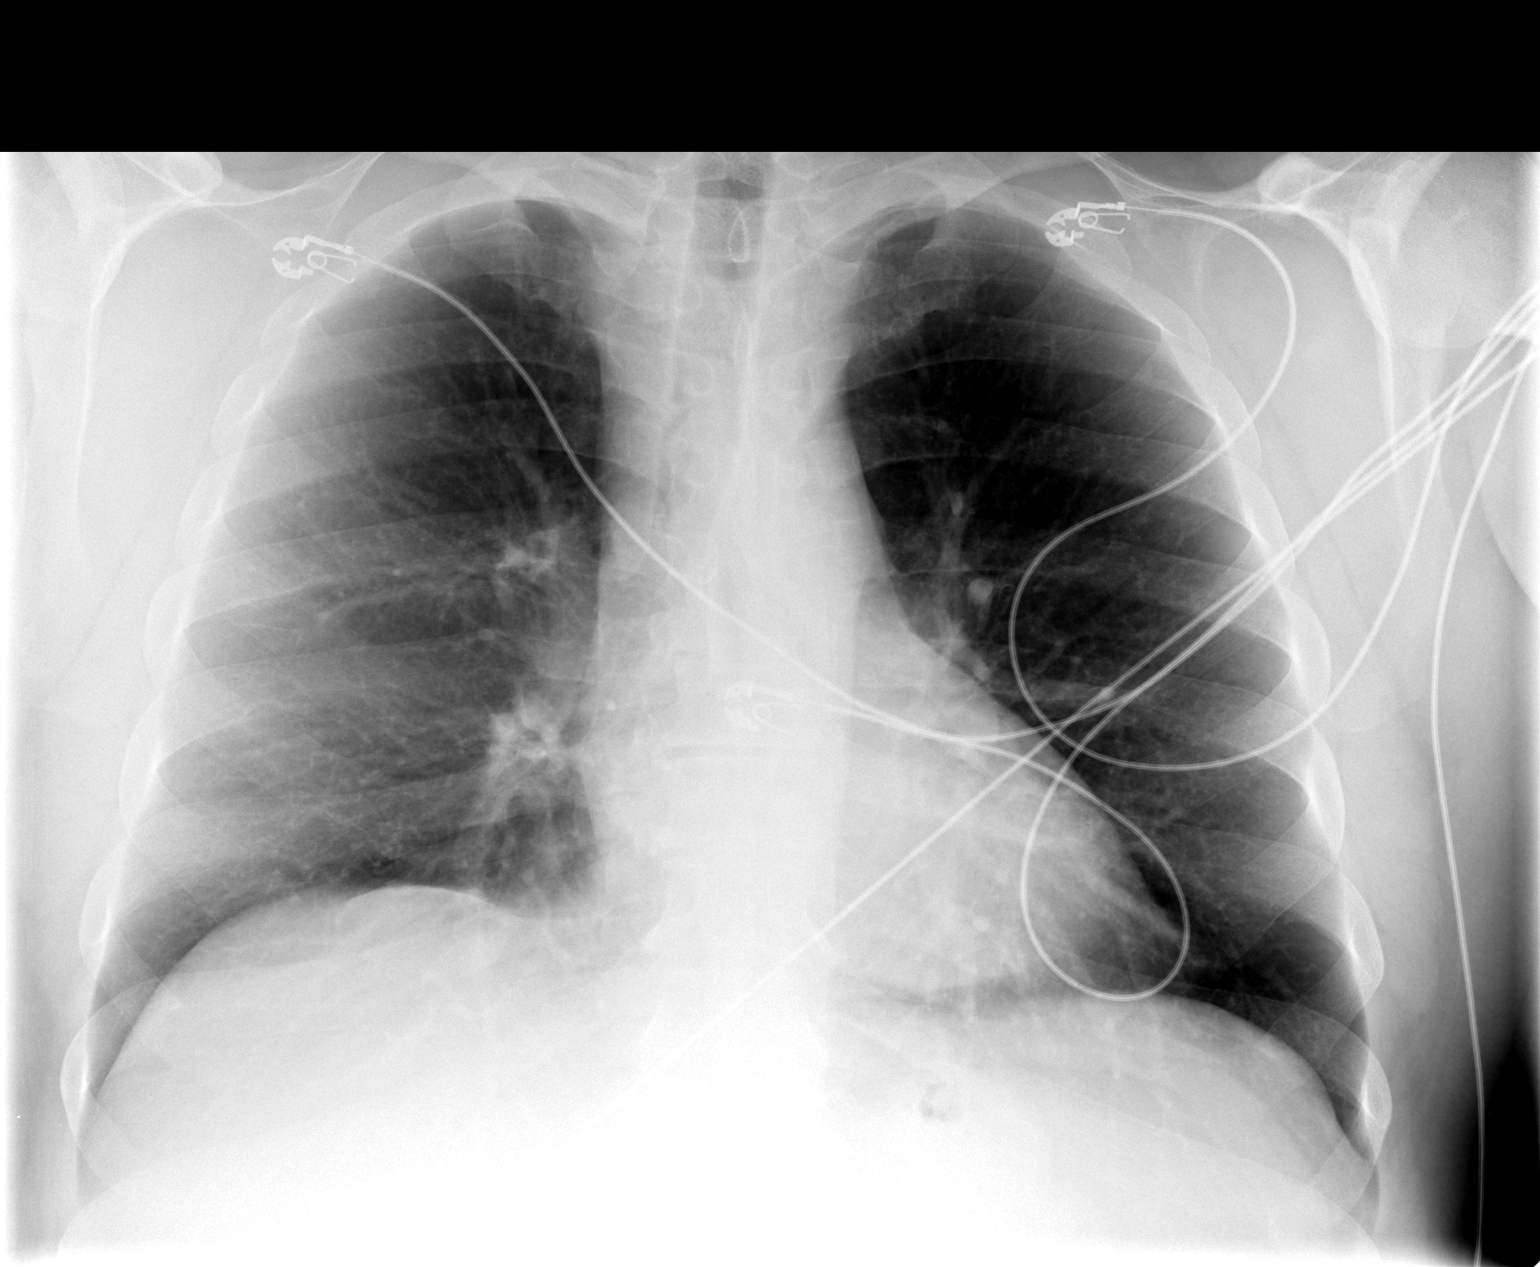

[view not recorded (2 of 2)]
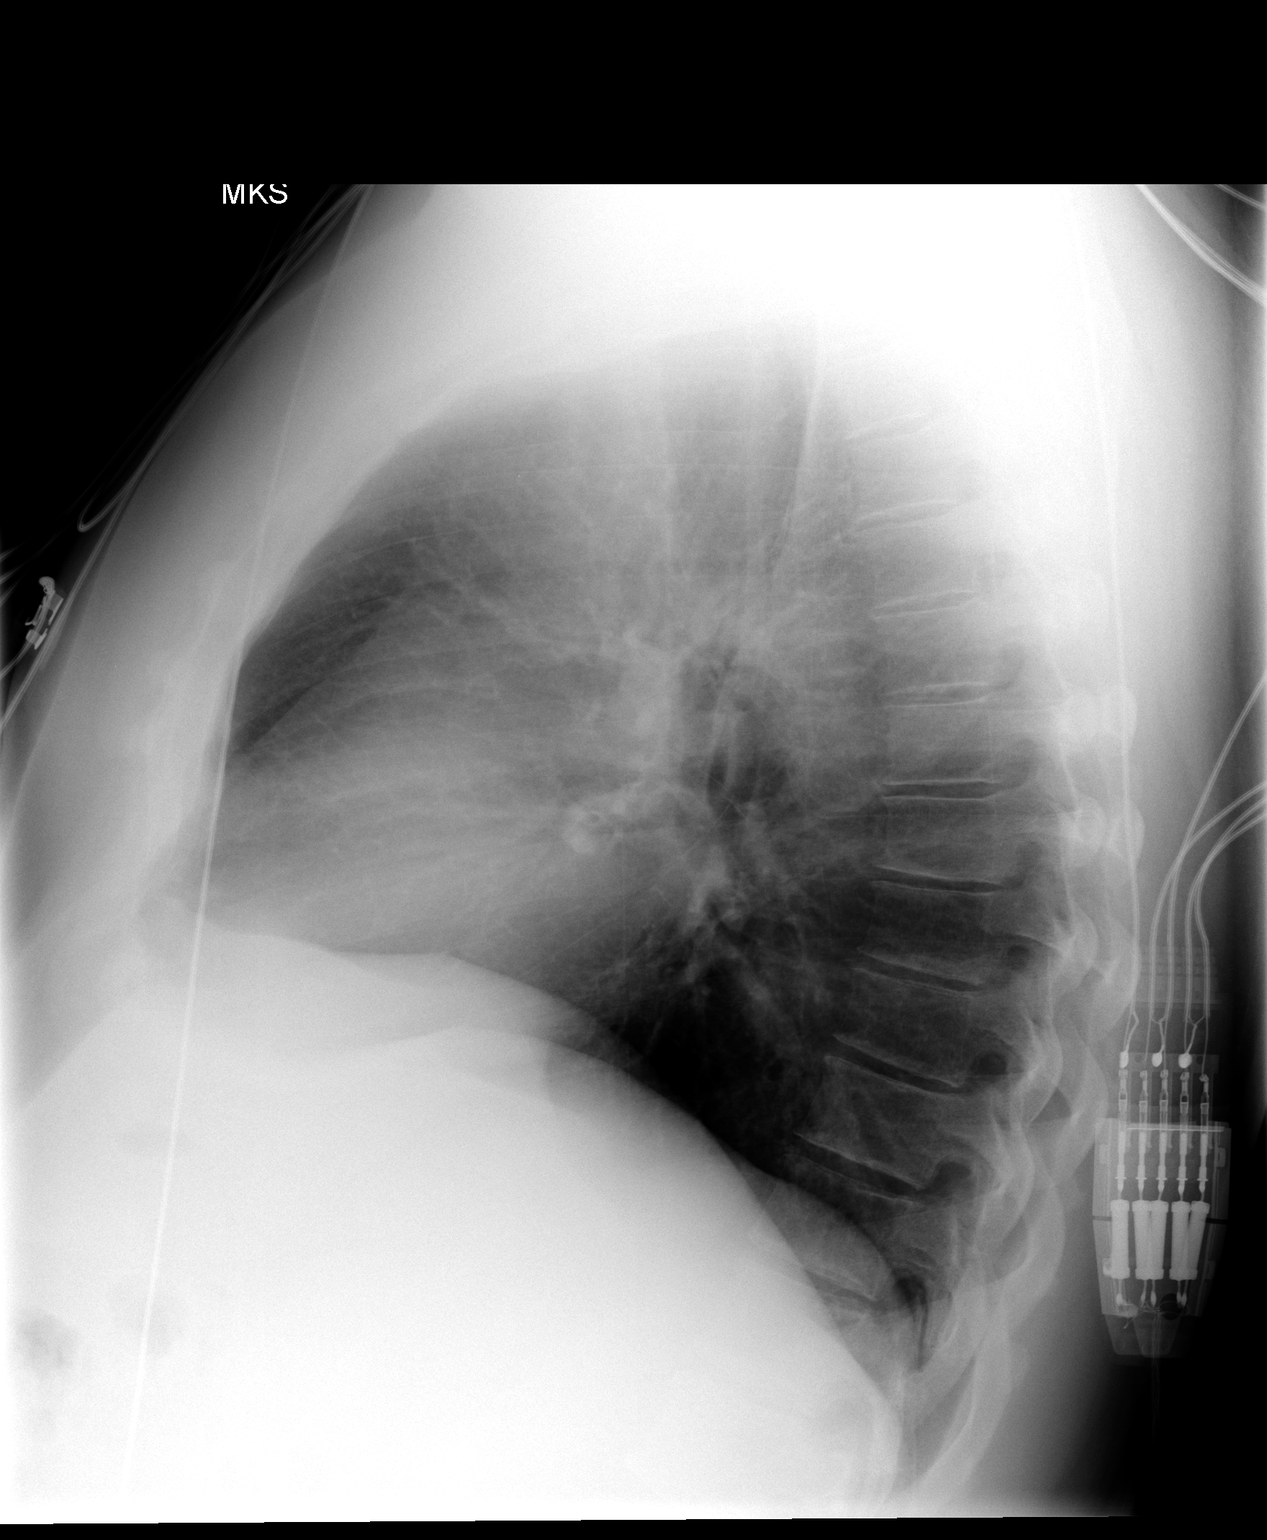

[2 of 2 positions shown; findings below may reference images not displayed]

FINDINGS: The heart size and mediastinal contours are within normal
limits.  Both lungs are clear.  The visualized skeletal structures
are unremarkable.
IMPRESSION: Negative exam.

## 2014-01-17 ENCOUNTER — Emergency Department (HOSPITAL_COMMUNITY): Payer: Medicaid Other

## 2014-01-17 ENCOUNTER — Encounter (HOSPITAL_COMMUNITY): Payer: Self-pay | Admitting: Emergency Medicine

## 2014-01-17 ENCOUNTER — Emergency Department (HOSPITAL_COMMUNITY)
Admission: EM | Admit: 2014-01-17 | Discharge: 2014-01-17 | Disposition: A | Discharge status: Home / Self Care | Payer: Medicaid Other | Attending: Emergency Medicine | Admitting: Emergency Medicine

## 2014-01-17 DIAGNOSIS — S199XXA Unspecified injury of neck, initial encounter: Secondary | ICD-10-CM | POA: Diagnosis not present

## 2014-01-17 DIAGNOSIS — F410 Panic disorder [episodic paroxysmal anxiety] without agoraphobia: Secondary | ICD-10-CM | POA: Diagnosis not present

## 2014-01-17 DIAGNOSIS — I10 Essential (primary) hypertension: Secondary | ICD-10-CM | POA: Diagnosis not present

## 2014-01-17 DIAGNOSIS — Z23 Encounter for immunization: Secondary | ICD-10-CM | POA: Diagnosis not present

## 2014-01-17 DIAGNOSIS — Z791 Long term (current) use of non-steroidal anti-inflammatories (NSAID): Secondary | ICD-10-CM | POA: Diagnosis not present

## 2014-01-17 DIAGNOSIS — S0993XA Unspecified injury of face, initial encounter: Secondary | ICD-10-CM | POA: Diagnosis present

## 2014-01-17 DIAGNOSIS — Z87891 Personal history of nicotine dependence: Secondary | ICD-10-CM | POA: Insufficient documentation

## 2014-01-17 DIAGNOSIS — Z85828 Personal history of other malignant neoplasm of skin: Secondary | ICD-10-CM | POA: Insufficient documentation

## 2014-01-17 DIAGNOSIS — S0093XA Contusion of unspecified part of head, initial encounter: Secondary | ICD-10-CM | POA: Insufficient documentation

## 2014-01-17 DIAGNOSIS — S0083XA Contusion of other part of head, initial encounter: Secondary | ICD-10-CM

## 2014-01-17 DIAGNOSIS — F209 Schizophrenia, unspecified: Secondary | ICD-10-CM | POA: Diagnosis not present

## 2014-01-17 DIAGNOSIS — E785 Hyperlipidemia, unspecified: Secondary | ICD-10-CM | POA: Diagnosis not present

## 2014-01-17 DIAGNOSIS — M1990 Unspecified osteoarthritis, unspecified site: Secondary | ICD-10-CM | POA: Diagnosis not present

## 2014-01-17 DIAGNOSIS — F319 Bipolar disorder, unspecified: Secondary | ICD-10-CM | POA: Insufficient documentation

## 2014-01-17 DIAGNOSIS — Z87442 Personal history of urinary calculi: Secondary | ICD-10-CM | POA: Insufficient documentation

## 2014-01-17 DIAGNOSIS — G43909 Migraine, unspecified, not intractable, without status migrainosus: Secondary | ICD-10-CM | POA: Diagnosis not present

## 2014-01-17 DIAGNOSIS — Z79899 Other long term (current) drug therapy: Secondary | ICD-10-CM | POA: Diagnosis not present

## 2014-01-17 DIAGNOSIS — S59901A Unspecified injury of right elbow, initial encounter: Secondary | ICD-10-CM | POA: Insufficient documentation

## 2014-01-17 MED ORDER — TETANUS-DIPHTH-ACELL PERTUSSIS 5-2.5-18.5 LF-MCG/0.5 IM SUSP
0.5000 mL | Freq: Once | INTRAMUSCULAR | Status: AC
  Administered 2014-01-17: 0.5 mL via INTRAMUSCULAR
  Filled 2014-01-17: qty 0.5

## 2014-01-17 MED ORDER — HYDROCODONE-ACETAMINOPHEN 5-325 MG PO TABS: 1.0000 | ORAL_TABLET | Freq: Four times a day (QID) | ORAL | Status: DC | PRN

## 2014-01-17 NOTE — Discharge Instructions (Signed)
Follow up with your md next week if not improving °
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
## 2014-04-03 ENCOUNTER — Encounter (HOSPITAL_COMMUNITY): Payer: Self-pay | Admitting: Emergency Medicine

## 2014-04-03 ENCOUNTER — Emergency Department (HOSPITAL_COMMUNITY): Payer: Medicaid Other

## 2014-04-03 ENCOUNTER — Emergency Department (HOSPITAL_COMMUNITY)
Admission: EM | Admit: 2014-04-03 | Discharge: 2014-04-03 | Disposition: A | Discharge status: Home / Self Care | Payer: Medicaid Other | Attending: Emergency Medicine | Admitting: Emergency Medicine

## 2014-04-03 DIAGNOSIS — F319 Bipolar disorder, unspecified: Secondary | ICD-10-CM | POA: Insufficient documentation

## 2014-04-03 DIAGNOSIS — F419 Anxiety disorder, unspecified: Secondary | ICD-10-CM | POA: Diagnosis not present

## 2014-04-03 DIAGNOSIS — R51 Headache: Secondary | ICD-10-CM | POA: Diagnosis not present

## 2014-04-03 DIAGNOSIS — Z85048 Personal history of other malignant neoplasm of rectum, rectosigmoid junction, and anus: Secondary | ICD-10-CM | POA: Diagnosis not present

## 2014-04-03 DIAGNOSIS — J40 Bronchitis, not specified as acute or chronic: Secondary | ICD-10-CM

## 2014-04-03 DIAGNOSIS — J209 Acute bronchitis, unspecified: Secondary | ICD-10-CM | POA: Diagnosis not present

## 2014-04-03 DIAGNOSIS — Z79899 Other long term (current) drug therapy: Secondary | ICD-10-CM | POA: Diagnosis not present

## 2014-04-03 DIAGNOSIS — M1990 Unspecified osteoarthritis, unspecified site: Secondary | ICD-10-CM | POA: Diagnosis not present

## 2014-04-03 DIAGNOSIS — Z87828 Personal history of other (healed) physical injury and trauma: Secondary | ICD-10-CM | POA: Insufficient documentation

## 2014-04-03 DIAGNOSIS — R05 Cough: Secondary | ICD-10-CM

## 2014-04-03 DIAGNOSIS — R059 Cough, unspecified: Secondary | ICD-10-CM

## 2014-04-03 DIAGNOSIS — J069 Acute upper respiratory infection, unspecified: Secondary | ICD-10-CM | POA: Diagnosis not present

## 2014-04-03 DIAGNOSIS — Z87442 Personal history of urinary calculi: Secondary | ICD-10-CM | POA: Insufficient documentation

## 2014-04-03 MED ORDER — PREDNISONE 50 MG PO TABS
60.0000 mg | ORAL_TABLET | Freq: Once | ORAL | Status: AC
  Administered 2014-04-03: 60 mg via ORAL
  Filled 2014-04-03 (×2): qty 1

## 2014-04-03 MED ORDER — IBUPROFEN 800 MG PO TABS
800.0000 mg | ORAL_TABLET | Freq: Once | ORAL | Status: AC
  Administered 2014-04-03: 800 mg via ORAL
  Filled 2014-04-03: qty 1

## 2014-04-03 MED ORDER — ALBUTEROL SULFATE HFA 108 (90 BASE) MCG/ACT IN AERS
2.0000 | INHALATION_SPRAY | Freq: Once | RESPIRATORY_TRACT | Status: AC
  Administered 2014-04-03: 2 via RESPIRATORY_TRACT
  Filled 2014-04-03: qty 6.7

## 2014-04-03 MED ORDER — AZITHROMYCIN 250 MG PO TABS: ORAL_TABLET | ORAL | Status: DC

## 2014-04-03 MED ORDER — PREDNISONE 10 MG PO TABS: ORAL_TABLET | ORAL | Status: DC

## 2014-04-03 MED ORDER — IBUPROFEN 800 MG PO TABS: 800.0000 mg | ORAL_TABLET | Freq: Three times a day (TID) | ORAL | Status: DC
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
## 2014-04-03 NOTE — Discharge Instructions (Signed)
Your x-ray and exam suggests bronchitis and upper respiratory infection. Please use albuterol 2 puffs every 4 hours for the next 5 days. Please use 2 tablets of Zithromax today, then 1 tablet daily until all taken. Please use the prednisone as prescribed. Use ibuprofen 3 times daily with food. Please wash hands frequently. Upper Respiratory Infection, Adult An upper respiratory infection (URI) is also sometimes known as the common cold. The upper respiratory tract includes the nose, sinuses, throat, trachea, and bronchi. Bronchi are the airways leading to the lungs. Most people improve within 1 week, but symptoms can last up to 2 weeks. A residual cough may last even longer.  CAUSES Many different viruses can infect the tissues lining the upper respiratory tract. The tissues become irritated and inflamed and often become very moist. Mucus production is also common. A cold is contagious. You can easily spread the virus to others by oral contact. This includes kissing, sharing a glass, coughing, or sneezing. Touching your mouth or nose and then touching a surface, which is then touched by another person, can also spread the virus. SYMPTOMS  Symptoms typically develop 1 to 3 days after you come in contact with a cold virus. Symptoms vary from person to person. They may include:  Runny nose.  Sneezing.  Nasal congestion.  Sinus irritation.  Sore throat.  Loss of voice (laryngitis).  Cough.  Fatigue.  Muscle aches.  Loss of appetite.  Headache.  Low-grade fever. DIAGNOSIS  You might diagnose your own cold based on familiar symptoms, since most people get a cold 2 to 3 times a year. Your caregiver can confirm this based on your exam. Most importantly, your caregiver can check that your symptoms are not due to another disease such as strep throat, sinusitis, pneumonia, asthma, or epiglottitis. Blood tests, throat tests, and X-rays are not necessary to diagnose a common cold, but they may  sometimes be helpful in excluding other more serious diseases. Your caregiver will decide if any further tests are required. RISKS AND COMPLICATIONS  You may be at risk for a more severe case of the common cold if you smoke cigarettes, have chronic heart disease (such as heart failure) or lung disease (such as asthma), or if you have a weakened immune system. The very young and very old are also at risk for more serious infections. Bacterial sinusitis, middle ear infections, and bacterial pneumonia can complicate the common cold. The common cold can worsen asthma and chronic obstructive pulmonary disease (COPD). Sometimes, these complications can require emergency medical care and may be life-threatening. PREVENTION  The best way to protect against getting a cold is to practice good hygiene. Avoid oral or hand contact with people with cold symptoms. Wash your hands often if contact occurs. There is no clear evidence that vitamin C, vitamin E, echinacea, or exercise reduces the chance of developing a cold. However, it is always recommended to get plenty of rest and practice good nutrition. TREATMENT  Treatment is directed at relieving symptoms. There is no cure. Antibiotics are not effective, because the infection is caused by a virus, not by bacteria. Treatment may include:  Increased fluid intake. Sports drinks offer valuable electrolytes, sugars, and fluids.  Breathing heated mist or steam (vaporizer or shower).  Eating chicken soup or other clear broths, and maintaining good nutrition.  Getting plenty of rest.  Using gargles or lozenges for comfort.  Controlling fevers with ibuprofen or acetaminophen as directed by your caregiver.  Increasing usage of your inhaler if  you have asthma. Zinc gel and zinc lozenges, taken in the first 24 hours of the common cold, can shorten the duration and lessen the severity of symptoms. Pain medicines may help with fever, muscle aches, and throat pain. A  variety of non-prescription medicines are available to treat congestion and runny nose. Your caregiver can make recommendations and may suggest nasal or lung inhalers for other symptoms.  HOME CARE INSTRUCTIONS   Only take over-the-counter or prescription medicines for pain, discomfort, or fever as directed by your caregiver.  Use a warm mist humidifier or inhale steam from a shower to increase air moisture. This may keep secretions moist and make it easier to breathe.  Drink enough water and fluids to keep your urine clear or pale yellow.  Rest as needed.  Return to work when your temperature has returned to normal or as your caregiver advises. You may need to stay home longer to avoid infecting others. You can also use a face mask and careful hand washing to prevent spread of the virus. SEEK MEDICAL CARE IF:   After the first few days, you feel you are getting worse rather than better.  You need your caregiver's advice about medicines to control symptoms.  You develop chills, worsening shortness of breath, or brown or red sputum. These may be signs of pneumonia.  You develop yellow or brown nasal discharge or pain in the face, especially when you bend forward. These may be signs of sinusitis.  You develop a fever, swollen neck glands, pain with swallowing, or white areas in the back of your throat. These may be signs of strep throat. SEEK IMMEDIATE MEDICAL CARE IF:   You have a fever.  You develop severe or persistent headache, ear pain, sinus pain, or chest pain.  You develop wheezing, a prolonged cough, cough up blood, or have a change in your usual mucus (if you have chronic lung disease).  You develop sore muscles or a stiff neck. Document Released: 09/23/2000 Document Revised: 06/22/2011 Document Reviewed: 07/05/2013 [facility] Patient Information 2015 [place], [place]. This information is not intended to replace advice given to you by your health care provider. Make sure you  discuss any questions you have with your health care provider.

## 2014-04-03 NOTE — ED Notes (Signed)
PA at bedside.

## 2014-04-03 NOTE — ED Notes (Signed)
Patient complaining of body aches and coughing x 1 week.

## 2014-09-21 ENCOUNTER — Emergency Department (HOSPITAL_COMMUNITY)
Admission: EM | Admit: 2014-09-21 | Discharge: 2014-09-22 | Disposition: A | Discharge status: Home / Self Care | Payer: Medicaid Other | Attending: Emergency Medicine | Admitting: Emergency Medicine

## 2014-09-21 ENCOUNTER — Encounter (HOSPITAL_COMMUNITY): Payer: Self-pay

## 2014-09-21 DIAGNOSIS — M1990 Unspecified osteoarthritis, unspecified site: Secondary | ICD-10-CM | POA: Insufficient documentation

## 2014-09-21 DIAGNOSIS — T50901A Poisoning by unspecified drugs, medicaments and biological substances, accidental (unintentional), initial encounter: Secondary | ICD-10-CM

## 2014-09-21 DIAGNOSIS — Z8639 Personal history of other endocrine, nutritional and metabolic disease: Secondary | ICD-10-CM | POA: Diagnosis not present

## 2014-09-21 DIAGNOSIS — Y999 Unspecified external cause status: Secondary | ICD-10-CM | POA: Diagnosis not present

## 2014-09-21 DIAGNOSIS — T43591A Poisoning by other antipsychotics and neuroleptics, accidental (unintentional), initial encounter: Secondary | ICD-10-CM | POA: Insufficient documentation

## 2014-09-21 DIAGNOSIS — Z8719 Personal history of other diseases of the digestive system: Secondary | ICD-10-CM | POA: Diagnosis not present

## 2014-09-21 DIAGNOSIS — Z048 Encounter for examination and observation for other specified reasons: Secondary | ICD-10-CM | POA: Insufficient documentation

## 2014-09-21 DIAGNOSIS — Z79899 Other long term (current) drug therapy: Secondary | ICD-10-CM | POA: Diagnosis not present

## 2014-09-21 DIAGNOSIS — F410 Panic disorder [episodic paroxysmal anxiety] without agoraphobia: Secondary | ICD-10-CM | POA: Diagnosis not present

## 2014-09-21 DIAGNOSIS — Z85828 Personal history of other malignant neoplasm of skin: Secondary | ICD-10-CM | POA: Insufficient documentation

## 2014-09-21 DIAGNOSIS — Z8679 Personal history of other diseases of the circulatory system: Secondary | ICD-10-CM | POA: Diagnosis not present

## 2014-09-21 DIAGNOSIS — Y929 Unspecified place or not applicable: Secondary | ICD-10-CM | POA: Insufficient documentation

## 2014-09-21 DIAGNOSIS — F319 Bipolar disorder, unspecified: Secondary | ICD-10-CM | POA: Insufficient documentation

## 2014-09-21 DIAGNOSIS — Z87442 Personal history of urinary calculi: Secondary | ICD-10-CM | POA: Insufficient documentation

## 2014-09-21 DIAGNOSIS — Y939 Activity, unspecified: Secondary | ICD-10-CM | POA: Diagnosis not present

## 2014-09-21 DIAGNOSIS — Z87891 Personal history of nicotine dependence: Secondary | ICD-10-CM | POA: Diagnosis not present

## 2014-09-21 DIAGNOSIS — Z87828 Personal history of other (healed) physical injury and trauma: Secondary | ICD-10-CM | POA: Insufficient documentation

## 2014-09-21 DIAGNOSIS — T43211A Poisoning by selective serotonin and norepinephrine reuptake inhibitors, accidental (unintentional), initial encounter: Secondary | ICD-10-CM | POA: Insufficient documentation

## 2014-09-21 NOTE — ED Notes (Signed)
Pt. Reports that he might have taken double doses of his Seroquel totaling 1600mg  and trazodone totaling between 300 and 600 mg. Pt. Denies any symptoms. Pt. Alert and oriented.
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
## 2014-09-21 NOTE — ED Notes (Signed)
Pt states he thinks he may have taken his meds twice tonight.  His usual dose of Seroquel 800 mg at bedtime, and his Trazadone is 300 mg at bedtime.  Pt is not able to explain why he may have taken extra.  Pt cannot verify amounts of pills in the bottle because he states he may have put 2 bottles together when he got his refills

## 2014-09-22 NOTE — Discharge Instructions (Signed)
Return to the emergency department if you develop any new or concerning symptoms.   Accidental Overdose A drug overdose occurs when a chemical substance (drug or medication) is used in amounts large enough to overcome a person. This may result in severe illness or death. This is a type of poisoning. Accidental overdoses of medications or other substances come from a variety of reasons. When this happens accidentally, it is often because the person taking the substance does not know enough about what they have taken. Drugs which commonly cause overdose deaths are alcohol, psychotropic medications (medications which affect the mind), pain medications, illegal drugs (street drugs) such as cocaine and heroin, and multiple drugs taken at the same time. It may result from careless behavior (such as over-indulging at a party). Other causes of overdose may include multiple drug use, a lapse in memory, or drug use after a period of no drug use.  Sometimes overdosing occurs because a person cannot remember if they have taken their medication.  A common unintentional overdose in young children involves multi-vitamins containing iron. Iron is a part of the hemoglobin molecule in blood. It is used to transport oxygen to living cells. When taken in small amounts, iron allows the body to restock hemoglobin. In large amounts, it causes problems in the body. If this overdose is not treated, it can lead to death. Never take medicines that show signs of tampering or do not seem quite right. Never take medicines in the dark or in poor lighting. Read the label and check each dose of medicine before you take it. When adults are poisoned, it happens most often through carelessness or lack of information. Taking medicines in the dark or taking medicine prescribed for someone else to treat the same type of problem is a dangerous practice. SYMPTOMS  Symptoms of overdose depend on the medication and amount taken. They can vary from  over-activity with stimulant over-dosage, to sleepiness from depressants such as alcohol, narcotics and tranquilizers. Confusion, dizziness, nausea and vomiting may be present. If problems are severe enough coma and death may result. DIAGNOSIS  Diagnosis and management are generally straightforward if the drug is known. Otherwise it is more difficult. At times, certain symptoms and signs exhibited by the patient, or blood tests, can reveal the drug in question.  TREATMENT  In an emergency department, most patients can be treated with supportive measures. Antidotes may be available if there has been an overdose of opioids or benzodiazepines. A rapid improvement will often occur if this is the cause of overdose. At home or away from medical care:  There may be no immediate problems or warning signs in children.  Not everything works well in all cases of poisoning.  Take immediate action. Poisons may act quickly.  If you think someone has swallowed medicine or a household product, and the person is unconscious, having seizures (convulsions), or is not breathing, immediately call for an ambulance. IF a person is conscious and appears to be doing OK but has swallowed a poison:  Do not wait to see what effect the poison will have. Immediately call a poison control center (listed in the white pages of your telephone book under "Poison Control" or inside the front cover with other emergency numbers). Some poison control centers have TTY capability for the deaf. Check with your local center if you or someone in your family requires this service.  Keep the container so you can read the label on the product for ingredients.  Describe what,  when, and how much was taken and the age and condition of the person poisoned. Inform them if the person is vomiting, choking, drowsy, shows a change in color or temperature of skin, is conscious or unconscious, or is convulsing.  Do not cause vomiting unless instructed  by medical personnel. Do not induce vomiting or force liquids into a person who is convulsing, unconscious, or very drowsy. Stay calm and in control.   Activated charcoal also is sometimes used in certain types of poisoning and you may wish to add a supply to your emergency medicines. It is available without a prescription. Call a poison control center before using this medication. PREVENTION  Thousands of children die every year from unintentional poisoning. This may be from household chemicals, poisoning from carbon monoxide in a car, taking their parent's medications, or simply taking a few iron pills or vitamins with iron. Poisoning comes from unexpected sources.  Store medicines out of the sight and reach of children, preferably in a locked cabinet. Do not keep medications in a food cabinet. Always store your medicines in a secure place. Get rid of expired medications.  If you have children living with you or have them as occasional guests, you should have child-resistant caps on your medicine containers. Keep everything out of reach. Child proof your home.  If you are called to the telephone or to answer the door while you are taking a medicine, take the container with you or put the medicine out of the reach of small children.  Do not take your medication in front of children. Do not tell your child how good a medication is and how good it is for them. They may get the idea it is more of a treat.  If you are an adult and have accidentally taken an overdose, you need to consider how this happened and what can be done to prevent it from happening again. If this was from a street drug or alcohol, determine if there is a problem that needs addressing. If you are not sure a problems exists, it is easy to talk to a professional and ask them if they think you have a problem. It is better to handle this problem in this way before it happens again and has a much worse consequence. Document Released:  06/13/2004 Document Revised: 06/22/2011 Document Reviewed: 11/19/2008 [facility] Patient Information 2015 [place], [place]. This information is not intended to replace advice given to you by your health care provider. Make sure you discuss any questions you have with your health care provider.
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
## 2015-01-26 ENCOUNTER — Emergency Department (HOSPITAL_COMMUNITY)
Admission: EM | Admit: 2015-01-26 | Discharge: 2015-01-26 | Disposition: A | Discharge status: Home / Self Care | Payer: Medicaid Other | Attending: Emergency Medicine | Admitting: Emergency Medicine

## 2015-01-26 ENCOUNTER — Encounter (HOSPITAL_COMMUNITY): Payer: Self-pay | Admitting: Emergency Medicine

## 2015-01-26 ENCOUNTER — Emergency Department (HOSPITAL_COMMUNITY): Payer: Medicaid Other

## 2015-01-26 DIAGNOSIS — F209 Schizophrenia, unspecified: Secondary | ICD-10-CM | POA: Diagnosis not present

## 2015-01-26 DIAGNOSIS — Z79899 Other long term (current) drug therapy: Secondary | ICD-10-CM | POA: Diagnosis not present

## 2015-01-26 DIAGNOSIS — F319 Bipolar disorder, unspecified: Secondary | ICD-10-CM | POA: Insufficient documentation

## 2015-01-26 DIAGNOSIS — Z791 Long term (current) use of non-steroidal anti-inflammatories (NSAID): Secondary | ICD-10-CM | POA: Insufficient documentation

## 2015-01-26 DIAGNOSIS — R110 Nausea: Secondary | ICD-10-CM | POA: Insufficient documentation

## 2015-01-26 DIAGNOSIS — F419 Anxiety disorder, unspecified: Secondary | ICD-10-CM | POA: Insufficient documentation

## 2015-01-26 DIAGNOSIS — Z87828 Personal history of other (healed) physical injury and trauma: Secondary | ICD-10-CM | POA: Diagnosis not present

## 2015-01-26 DIAGNOSIS — I10 Essential (primary) hypertension: Secondary | ICD-10-CM | POA: Diagnosis not present

## 2015-01-26 DIAGNOSIS — R319 Hematuria, unspecified: Secondary | ICD-10-CM | POA: Insufficient documentation

## 2015-01-26 DIAGNOSIS — R1031 Right lower quadrant pain: Secondary | ICD-10-CM

## 2015-01-26 DIAGNOSIS — Z8639 Personal history of other endocrine, nutritional and metabolic disease: Secondary | ICD-10-CM | POA: Diagnosis not present

## 2015-01-26 DIAGNOSIS — N200 Calculus of kidney: Secondary | ICD-10-CM

## 2015-01-26 DIAGNOSIS — Z8719 Personal history of other diseases of the digestive system: Secondary | ICD-10-CM | POA: Diagnosis not present

## 2015-01-26 DIAGNOSIS — Z87891 Personal history of nicotine dependence: Secondary | ICD-10-CM | POA: Insufficient documentation

## 2015-01-26 DIAGNOSIS — R109 Unspecified abdominal pain: Secondary | ICD-10-CM

## 2015-01-26 DIAGNOSIS — Z85828 Personal history of other malignant neoplasm of skin: Secondary | ICD-10-CM | POA: Insufficient documentation

## 2015-01-26 DIAGNOSIS — M1990 Unspecified osteoarthritis, unspecified site: Secondary | ICD-10-CM | POA: Diagnosis not present

## 2015-01-26 LAB — CBC WITH DIFFERENTIAL/PLATELET
BASOS ABS: 0 10*3/uL (ref 0.0–0.1)
Basophils Relative: 0 %
EOS ABS: 0.1 10*3/uL (ref 0.0–0.7)
EOS PCT: 1 %
HCT: 41.5 % (ref 39.0–52.0)
Hemoglobin: 14.5 g/dL (ref 13.0–17.0)
LYMPHS PCT: 14 %
Lymphs Abs: 1.4 10*3/uL (ref 0.7–4.0)
MCH: 30.2 pg (ref 26.0–34.0)
MCHC: 34.9 g/dL (ref 30.0–36.0)
MCV: 86.5 fL (ref 78.0–100.0)
MONO ABS: 0.7 10*3/uL (ref 0.1–1.0)
Monocytes Relative: 7 %
Neutro Abs: 8 10*3/uL — ABNORMAL HIGH (ref 1.7–7.7)
Neutrophils Relative %: 78 %
PLATELETS: 204 10*3/uL (ref 150–400)
RBC: 4.8 MIL/uL (ref 4.22–5.81)
RDW: 12.8 % (ref 11.5–15.5)
WBC: 10.3 10*3/uL (ref 4.0–10.5)

## 2015-01-26 LAB — COMPREHENSIVE METABOLIC PANEL
ALT: 25 U/L (ref 17–63)
AST: 22 U/L (ref 15–41)
Albumin: 4 g/dL (ref 3.5–5.0)
Alkaline Phosphatase: 48 U/L (ref 38–126)
Anion gap: 4 — ABNORMAL LOW (ref 5–15)
BUN: 12 mg/dL (ref 6–20)
CHLORIDE: 114 mmol/L — AB (ref 101–111)
CO2: 23 mmol/L (ref 22–32)
CREATININE: 1.01 mg/dL (ref 0.61–1.24)
Calcium: 8.7 mg/dL — ABNORMAL LOW (ref 8.9–10.3)
GFR calc non Af Amer: >60 mL/min (ref >60)
GLUCOSE: 115 mg/dL — AB (ref 65–99)
POTASSIUM: 3.8 mmol/L (ref 3.5–5.1)
Sodium: 141 mmol/L (ref 135–145)
Total Bilirubin: 0.9 mg/dL (ref 0.3–1.2)
Total Protein: 6.6 g/dL (ref 6.5–8.1)

## 2015-01-26 LAB — URINALYSIS, ROUTINE W REFLEX MICROSCOPIC
Bilirubin Urine: NEGATIVE
Glucose, UA: NEGATIVE mg/dL
KETONES UR: NEGATIVE mg/dL
Leukocytes, UA: NEGATIVE
Nitrite: NEGATIVE
PH: 6.5 (ref 5.0–8.0)
PROTEIN: NEGATIVE mg/dL
Specific Gravity, Urine: <1.005 — ABNORMAL LOW (ref 1.005–1.030)
Urobilinogen, UA: 0.2 mg/dL (ref 0.0–1.0)

## 2015-01-26 LAB — URINE MICROSCOPIC-ADD ON

## 2015-01-26 MED ORDER — TAMSULOSIN HCL 0.4 MG PO CAPS: 0.4000 mg | ORAL_CAPSULE | Freq: Every day | ORAL | Status: DC

## 2015-01-26 MED ORDER — HYDROCODONE-ACETAMINOPHEN 5-325 MG PO TABS: 1.0000 | ORAL_TABLET | Freq: Four times a day (QID) | ORAL | Status: DC | PRN

## 2015-01-26 MED ORDER — KETOROLAC TROMETHAMINE 30 MG/ML IJ SOLN
30.0000 mg | Freq: Once | INTRAMUSCULAR | Status: AC
  Administered 2015-01-26: 30 mg via INTRAVENOUS
  Filled 2015-01-26: qty 1

## 2015-01-26 NOTE — ED Notes (Signed)
Having kidney pain on and off for 2-3 weeks.  Woke up this am in severe pain and was in tears when EMS arrived.  EMS gave 30 mg of Toradol, 4 mg of Zofran and a total of 10 mg of Morphine.  Currently rating pain 1/10.  # 20g jelco to right FA with NS at [facility].  Pt has history of kidney stones.

## 2015-01-26 NOTE — Discharge Instructions (Signed)
As discussed, it is important that you follow up as soon as possible with your physician for continued management of your condition. ° °If you develop any new, or concerning changes in your condition, please return to the emergency department immediately. ° °
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
## 2015-01-30 ENCOUNTER — Encounter (HOSPITAL_COMMUNITY): Payer: Self-pay | Admitting: Emergency Medicine

## 2015-01-30 ENCOUNTER — Emergency Department (HOSPITAL_COMMUNITY)
Admission: EM | Admit: 2015-01-30 | Discharge: 2015-01-30 | Disposition: A | Discharge status: Home / Self Care | Payer: Medicaid Other | Attending: Emergency Medicine | Admitting: Emergency Medicine

## 2015-01-30 DIAGNOSIS — R21 Rash and other nonspecific skin eruption: Secondary | ICD-10-CM | POA: Diagnosis present

## 2015-01-30 DIAGNOSIS — Z8639 Personal history of other endocrine, nutritional and metabolic disease: Secondary | ICD-10-CM | POA: Diagnosis not present

## 2015-01-30 DIAGNOSIS — F419 Anxiety disorder, unspecified: Secondary | ICD-10-CM | POA: Diagnosis not present

## 2015-01-30 DIAGNOSIS — Z8719 Personal history of other diseases of the digestive system: Secondary | ICD-10-CM | POA: Insufficient documentation

## 2015-01-30 DIAGNOSIS — Z8679 Personal history of other diseases of the circulatory system: Secondary | ICD-10-CM | POA: Diagnosis not present

## 2015-01-30 DIAGNOSIS — T446X5A Adverse effect of alpha-adrenoreceptor antagonists, initial encounter: Secondary | ICD-10-CM | POA: Insufficient documentation

## 2015-01-30 DIAGNOSIS — M1990 Unspecified osteoarthritis, unspecified site: Secondary | ICD-10-CM | POA: Insufficient documentation

## 2015-01-30 DIAGNOSIS — Z87891 Personal history of nicotine dependence: Secondary | ICD-10-CM | POA: Diagnosis not present

## 2015-01-30 DIAGNOSIS — F329 Major depressive disorder, single episode, unspecified: Secondary | ICD-10-CM | POA: Insufficient documentation

## 2015-01-30 DIAGNOSIS — T7840XA Allergy, unspecified, initial encounter: Secondary | ICD-10-CM

## 2015-01-30 DIAGNOSIS — Z87828 Personal history of other (healed) physical injury and trauma: Secondary | ICD-10-CM | POA: Diagnosis not present

## 2015-01-30 DIAGNOSIS — Z87442 Personal history of urinary calculi: Secondary | ICD-10-CM | POA: Insufficient documentation

## 2015-01-30 DIAGNOSIS — I10 Essential (primary) hypertension: Secondary | ICD-10-CM | POA: Insufficient documentation

## 2015-01-30 DIAGNOSIS — Z85828 Personal history of other malignant neoplasm of skin: Secondary | ICD-10-CM | POA: Diagnosis not present

## 2015-01-30 DIAGNOSIS — L270 Generalized skin eruption due to drugs and medicaments taken internally: Secondary | ICD-10-CM | POA: Insufficient documentation

## 2015-01-30 MED ORDER — ACETAMINOPHEN 500 MG PO TABS: 1000.0000 mg | ORAL_TABLET | Freq: Once | ORAL | Status: DC

## 2015-01-30 NOTE — ED Provider Notes (Signed)
CSN: [id number]     Arrival date & time 01/30/15  1400 History   First MD Initiated Contact with Patient 01/30/15 1534     Chief Complaint  Patient presents with  . Allergic Reaction     (Consider location/radiation/quality/duration/timing/severity/associated sxs/prior Treatment) HPI Comments: Patient is a 49 year old male who presents to the emergency department with a complaint of allergic reaction.  The patient states that on October 16 he was treated for kidney stones, and placed on Flomax. Approximately 48 hours after starting the Flomax, he states he began to develop a rash on the buttocks area, then he started having some itching of the scalp and itching of the joints. He denies any shortness of breath, or difficulty speaking.  Patient is a 49 y.o. male presenting with allergic reaction. The history is provided by the patient.  Allergic Reaction Presenting symptoms: rash     Past Medical History  Diagnosis Date  . Bipolar disorder ([place])     [facility]  . Depression   . Kidney stones   . Migraine   . Arthritis   . Hyperlipidemia   . Anxiety disorder CHEST PAIN  . Panic attacks   . Basal cell carcinoma of anal skin   . Internal hemorrhoid   . Rectum pain   . Nasal sinus congestion   . 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 Hypertension     denies  . Schizophrenia [facility])    Past Surgical History  Procedure Laterality Date  . Knee surgery    . Mouth surgery    . Left knee surgery  AGE 33  . Teeth extractions and excision mouth lesion  AGE 43  . Appendectomy  AGE 45  . Rectal biopsy  03/21/2012    Procedure: BIOPSY RECTAL;  Surgeon: Leighton Ruff, MD;  Location: [facility];  Service: General;  Laterality: N/A;  WIDE LOCAL EXCISON OF ANAL MASS, POSSIBLE HEMORRHOIDECTOMY  . Hemorrhoid surgery  03/21/2012    Procedure: HEMORRHOIDECTOMY;  Surgeon: Leighton Ruff, MD;  Location: [facility];  Service: General;  Laterality: N/A;  . Transanal excision of rectal mass  04/07/2012    Procedure: TRANSANAL EXCISION OF RECTAL MASS;  Surgeon: Leighton Ruff, MD;  [facility];  Service: General;  Laterality: N/A;  Wide Local Excision of Perianal Mass  . Colonoscopy  2006    Dr. Rowe Pavy: normal colonoscopy.  . Colonoscopy N/A 03/06/2013    Dr. Gala Romney- friable hemorrhoids;o/w negative colonoscopy  . Esophagogastroduodenoscopy (egd) with esophageal dilation N/A 03/06/2013    Dr. Gala Romney- erosive/ulcerative reflux esophagitis. reactive gastopathy with mild chronic inflamation on bx.   Family History  Problem Relation Age of Onset  . Cancer Father     Leukemia  . Colon cancer Father     62s   Social History  Substance Use Topics  . Smoking status: Former Smoker    Types: Cigarettes  . Smokeless tobacco: Current User    Types: Snuff, Chew     Comment: DIP AND CHEW TOBACCO FOR 40 YRS/ SMOKING UNTIL AGE 39 APPROX. >5 YRS  (PER PT DENIES SMOKING AT THIS TIME)  . Alcohol Use: Yes     Comment: occ    Review of Systems  Skin: Positive for rash.  Psychiatric/Behavioral: The patient is nervous/anxious.        Depression  All other systems reviewed and are negative.     Allergies  Rocephin and Flomax  Home Medications   Prior to Admission medications   Medication Sig Start Date End Date Taking? Authorizing Provider  HYDROcodone-acetaminophen (NORCO/VICODIN) 5-325 MG tablet Take 1 tablet by mouth every 6 (six) hours as needed for severe pain. Patient not taking: Reported on 01/30/2015 01/26/15   Carmin Muskrat, MD  predniSONE (DELTASONE) 10 MG tablet 5,4,3,2,1 - take with food Patient not taking: Reported on 01/26/2015 04/03/14   Lily Kocher, PA-C   BP 147/78 mmHg  Pulse 69  Temp(Src) 98.1 F (36.7 C) (Oral)  Resp 16  Ht 5\' 8"  (1.727 m)  Wt 217 lb (98.431 kg)  BMI 33.00 kg/m2  SpO2 97% Physical Exam  Constitutional: He is oriented to person, place, and  time. He appears well-developed and well-nourished.  Non-toxic appearance.  HENT:  Head: Normocephalic.  Right Ear: Tympanic membrane and external ear normal.  Left Ear: Tympanic membrane and external ear normal.  Eyes: EOM and lids are normal. Pupils are equal, round, and reactive to light.  Neck: Normal range of motion. Neck supple. Carotid bruit is not present.  Cardiovascular: Normal rate, regular rhythm, normal heart sounds, intact distal pulses and normal pulses.   Pulmonary/Chest: Breath sounds normal. No respiratory distress.  Abdominal: Soft. Bowel sounds are normal. There is no tenderness. There is no guarding.  Musculoskeletal: Normal range of motion.  Lymphadenopathy:       Head (right side): No submandibular adenopathy present.       Head (left side): No submandibular adenopathy present.    He has no cervical adenopathy.  Neurological: He is alert and oriented to person, place, and time. He has normal strength. No cranial nerve deficit or sensory deficit.  Skin: Skin is warm and dry. Rash noted.  Psychiatric: He has a normal mood and affect. His speech is normal.  Nursing note and vitals reviewed.   ED Course  Procedures (including critical care time) Labs Review Labs Reviewed - No data to display  Imaging Review No results found. I have personally reviewed and evaluated these images and lab results as part of my medical decision-making.   EKG Interpretation None      MDM  There is no facial swelling. The airway is patent. The patient speaks in complete sentences. There is no wheezing or difficulty breathing. There is no edema of the upper or lower extremities. There is a there is a papular rash about the buttocks and the back area. There are a few papular areas about the arms.   I have asked the patient to stop the Flomax at this time. He will use Zyrtec or Claritin for now. He is to follow with urology to see if they want to substitute another medication for the  Flomax. He is to return to the emergency department immediately if any emergent changes, problems, or concerns.    Final diagnoses:  Allergic reaction, initial encounter    **I have reviewed nursing notes, vital signs, and all appropriate lab and imaging results for this patient.Lily Kocher, PA-C 01/30/15 2154  Gareth Morgan, MD 02/01/15 1455

## 2015-01-30 NOTE — ED Notes (Signed)
Pt states that he was seen for kidney stones 10/16 and given flomax - Last  Night broke out in a rash and started itching

## 2015-01-30 NOTE — Discharge Instructions (Signed)
Please stop your Flomax for nail. Please discuss this with the urology specialist. Please use Claritin or Zyrtec for itching. Use Benadryl at bedtime if needed.

## 2015-02-01 ENCOUNTER — Ambulatory Visit: Payer: Medicaid Other | Admitting: Urology

## 2015-11-29 ENCOUNTER — Inpatient Hospital Stay (HOSPITAL_COMMUNITY)
Admission: AD | Admit: 2015-11-29 | Discharge: 2015-12-03 | DRG: 885 | Disposition: A | Discharge status: Home / Self Care | Payer: Medicaid Other | Source: Intra-hospital | Attending: Psychiatry | Admitting: Psychiatry

## 2015-11-29 ENCOUNTER — Encounter (HOSPITAL_COMMUNITY): Payer: Self-pay

## 2015-11-29 ENCOUNTER — Emergency Department (HOSPITAL_COMMUNITY)
Admission: EM | Admit: 2015-11-29 | Discharge: 2015-11-29 | Disposition: A | Discharge status: Other Acute Inpatient Hospital | Payer: Medicaid Other

## 2015-11-29 DIAGNOSIS — Z79899 Other long term (current) drug therapy: Secondary | ICD-10-CM | POA: Insufficient documentation

## 2015-11-29 DIAGNOSIS — Z800 Family history of malignant neoplasm of digestive organs: Secondary | ICD-10-CM | POA: Diagnosis not present

## 2015-11-29 DIAGNOSIS — Z806 Family history of leukemia: Secondary | ICD-10-CM | POA: Diagnosis not present

## 2015-11-29 DIAGNOSIS — F1220 Cannabis dependence, uncomplicated: Secondary | ICD-10-CM | POA: Diagnosis not present

## 2015-11-29 DIAGNOSIS — Z915 Personal history of self-harm: Secondary | ICD-10-CM

## 2015-11-29 DIAGNOSIS — F329 Major depressive disorder, single episode, unspecified: Secondary | ICD-10-CM | POA: Insufficient documentation

## 2015-11-29 DIAGNOSIS — Z87442 Personal history of urinary calculi: Secondary | ICD-10-CM

## 2015-11-29 DIAGNOSIS — G4700 Insomnia, unspecified: Secondary | ICD-10-CM | POA: Diagnosis present

## 2015-11-29 DIAGNOSIS — Z811 Family history of alcohol abuse and dependence: Secondary | ICD-10-CM

## 2015-11-29 DIAGNOSIS — I10 Essential (primary) hypertension: Secondary | ICD-10-CM | POA: Diagnosis present

## 2015-11-29 DIAGNOSIS — Z008 Encounter for other general examination: Secondary | ICD-10-CM | POA: Diagnosis present

## 2015-11-29 DIAGNOSIS — F209 Schizophrenia, unspecified: Secondary | ICD-10-CM | POA: Diagnosis present

## 2015-11-29 DIAGNOSIS — F319 Bipolar disorder, unspecified: Secondary | ICD-10-CM | POA: Diagnosis not present

## 2015-11-29 DIAGNOSIS — F13239 Sedative, hypnotic or anxiolytic dependence with withdrawal, unspecified: Secondary | ICD-10-CM | POA: Diagnosis present

## 2015-11-29 DIAGNOSIS — F411 Generalized anxiety disorder: Secondary | ICD-10-CM | POA: Diagnosis present

## 2015-11-29 DIAGNOSIS — F17290 Nicotine dependence, other tobacco product, uncomplicated: Secondary | ICD-10-CM | POA: Insufficient documentation

## 2015-11-29 DIAGNOSIS — R45851 Suicidal ideations: Secondary | ICD-10-CM | POA: Diagnosis present

## 2015-11-29 DIAGNOSIS — Z85828 Personal history of other malignant neoplasm of skin: Secondary | ICD-10-CM | POA: Diagnosis not present

## 2015-11-29 DIAGNOSIS — F1410 Cocaine abuse, uncomplicated: Secondary | ICD-10-CM | POA: Diagnosis not present

## 2015-11-29 DIAGNOSIS — Z87891 Personal history of nicotine dependence: Secondary | ICD-10-CM | POA: Diagnosis not present

## 2015-11-29 DIAGNOSIS — F1420 Cocaine dependence, uncomplicated: Secondary | ICD-10-CM | POA: Clinically undetermined

## 2015-11-29 DIAGNOSIS — F17220 Nicotine dependence, chewing tobacco, uncomplicated: Secondary | ICD-10-CM | POA: Diagnosis present

## 2015-11-29 DIAGNOSIS — E785 Hyperlipidemia, unspecified: Secondary | ICD-10-CM | POA: Diagnosis present

## 2015-11-29 DIAGNOSIS — F410 Panic disorder [episodic paroxysmal anxiety] without agoraphobia: Secondary | ICD-10-CM | POA: Diagnosis present

## 2015-11-29 DIAGNOSIS — F32A Depression, unspecified: Secondary | ICD-10-CM

## 2015-11-29 HISTORY — DX: Bipolar disorder, unspecified: F31.9

## 2015-11-29 LAB — COMPREHENSIVE METABOLIC PANEL
ALBUMIN: 4.5 g/dL (ref 3.5–5.0)
ALK PHOS: 50 U/L (ref 38–126)
ALT: 16 U/L — ABNORMAL LOW (ref 17–63)
AST: 16 U/L (ref 15–41)
Anion gap: 7 (ref 5–15)
BILIRUBIN TOTAL: 1 mg/dL (ref 0.3–1.2)
BUN: 14 mg/dL (ref 6–20)
CALCIUM: 9.4 mg/dL (ref 8.9–10.3)
CO2: 25 mmol/L (ref 22–32)
Chloride: 105 mmol/L (ref 101–111)
Creatinine, Ser: 0.98 mg/dL (ref 0.61–1.24)
GFR calc Af Amer: >60 mL/min (ref >60)
GFR calc non Af Amer: >60 mL/min (ref >60)
GLUCOSE: 115 mg/dL — AB (ref 65–99)
Potassium: 4.1 mmol/L (ref 3.5–5.1)
Sodium: 137 mmol/L (ref 135–145)
TOTAL PROTEIN: 7.4 g/dL (ref 6.5–8.1)

## 2015-11-29 LAB — CBC
HEMATOCRIT: 46.1 % (ref 39.0–52.0)
Hemoglobin: 15.9 g/dL (ref 13.0–17.0)
MCH: 29.5 pg (ref 26.0–34.0)
MCHC: 34.5 g/dL (ref 30.0–36.0)
MCV: 85.5 fL (ref 78.0–100.0)
Platelets: 250 10*3/uL (ref 150–400)
RBC: 5.39 MIL/uL (ref 4.22–5.81)
RDW: 12.7 % (ref 11.5–15.5)
WBC: 12 10*3/uL — ABNORMAL HIGH (ref 4.0–10.5)

## 2015-11-29 LAB — RAPID URINE DRUG SCREEN, HOSP PERFORMED
Amphetamines: NOT DETECTED
BARBITURATES: NOT DETECTED
Benzodiazepines: POSITIVE — AB
Cocaine: POSITIVE — AB
Opiates: NOT DETECTED
Tetrahydrocannabinol: NOT DETECTED

## 2015-11-29 LAB — ACETAMINOPHEN LEVEL: Acetaminophen (Tylenol), Serum: <10 ug/mL — ABNORMAL LOW (ref 10–30)

## 2015-11-29 LAB — SALICYLATE LEVEL: Salicylate Lvl: <4 mg/dL (ref 2.8–30.0)

## 2015-11-29 LAB — ETHANOL: Alcohol, Ethyl (B): <5 mg/dL (ref <5)

## 2015-11-29 MED ORDER — ALUM & MAG HYDROXIDE-SIMETH 200-200-20 MG/5ML PO SUSP: 30.0000 mL | ORAL | Status: DC | PRN

## 2015-11-29 MED ORDER — METHOCARBAMOL 500 MG PO TABS
1000.0000 mg | ORAL_TABLET | Freq: Four times a day (QID) | ORAL | Status: DC | PRN
  Administered 2015-11-29: 1000 mg via ORAL
  Filled 2015-11-29: qty 2

## 2015-11-29 MED ORDER — IBUPROFEN 400 MG PO TABS
200.0000 mg | ORAL_TABLET | Freq: Four times a day (QID) | ORAL | Status: DC | PRN
  Administered 2015-11-29: 200 mg via ORAL
  Filled 2015-11-29: qty 1

## 2015-11-29 MED ORDER — ACETAMINOPHEN 325 MG PO TABS
650.0000 mg | ORAL_TABLET | ORAL | Status: DC | PRN
  Administered 2015-11-29: 650 mg via ORAL
  Filled 2015-11-29: qty 2

## 2015-11-29 MED ORDER — LOPERAMIDE HCL 2 MG PO CAPS: 2.0000 mg | ORAL_CAPSULE | ORAL | Status: DC | PRN

## 2015-11-29 MED ORDER — ACETAMINOPHEN 325 MG PO TABS
650.0000 mg | ORAL_TABLET | Freq: Four times a day (QID) | ORAL | Status: DC | PRN
Start: 2015-11-29 — End: 2015-12-03
  Filled 2015-11-29: qty 2

## 2015-11-29 MED ORDER — MAGNESIUM HYDROXIDE 400 MG/5ML PO SUSP: 30.0000 mL | Freq: Every day | ORAL | Status: DC | PRN

## 2015-11-29 MED ORDER — CLONIDINE HCL 0.1 MG PO TABS: 0.1000 mg | ORAL_TABLET | Freq: Every day | ORAL | Status: DC

## 2015-11-29 MED ORDER — HYDROXYZINE HCL 25 MG PO TABS
25.0000 mg | ORAL_TABLET | Freq: Three times a day (TID) | ORAL | Status: DC | PRN
  Administered 2015-11-29 – 2015-12-01 (×4): 25 mg via ORAL
  Filled 2015-11-29 (×4): qty 1

## 2015-11-29 MED ORDER — CLONIDINE HCL 0.1 MG PO TABS
0.1000 mg | ORAL_TABLET | Freq: Four times a day (QID) | ORAL | Status: DC
  Administered 2015-11-29: 0.1 mg via ORAL
  Filled 2015-11-29: qty 1

## 2015-11-29 MED ORDER — ZOLPIDEM TARTRATE 5 MG PO TABS: 10.0000 mg | ORAL_TABLET | Freq: Every evening | ORAL | Status: DC | PRN

## 2015-11-29 MED ORDER — METHOCARBAMOL 500 MG PO TABS
500.0000 mg | ORAL_TABLET | Freq: Three times a day (TID) | ORAL | Status: DC | PRN
  Administered 2015-11-29 – 2015-12-02 (×7): 500 mg via ORAL
  Filled 2015-11-29 (×7): qty 1

## 2015-11-29 MED ORDER — TRAZODONE HCL 50 MG PO TABS: 300.0000 mg | ORAL_TABLET | Freq: Every day | ORAL | Status: DC

## 2015-11-29 MED ORDER — NICOTINE 21 MG/24HR TD PT24
21.0000 mg | MEDICATED_PATCH | Freq: Every day | TRANSDERMAL | Status: DC
  Filled 2015-11-29 (×7): qty 1

## 2015-11-29 MED ORDER — DICYCLOMINE HCL 20 MG PO TABS
20.0000 mg | ORAL_TABLET | Freq: Four times a day (QID) | ORAL | Status: DC | PRN
  Filled 2015-11-29: qty 1

## 2015-11-29 MED ORDER — IBUPROFEN 800 MG PO TABS
800.0000 mg | ORAL_TABLET | Freq: Three times a day (TID) | ORAL | Status: DC | PRN
  Administered 2015-11-29 – 2015-12-02 (×4): 800 mg via ORAL
  Filled 2015-11-29 (×4): qty 1

## 2015-11-29 MED ORDER — NICOTINE 21 MG/24HR TD PT24
21.0000 mg | MEDICATED_PATCH | Freq: Every day | TRANSDERMAL | Status: DC
  Administered 2015-11-29: 21 mg via TRANSDERMAL
  Filled 2015-11-29: qty 1

## 2015-11-29 MED ORDER — DICYCLOMINE HCL 10 MG PO CAPS: 20.0000 mg | ORAL_CAPSULE | Freq: Four times a day (QID) | ORAL | Status: DC | PRN

## 2015-11-29 MED ORDER — HYDROXYZINE HCL 25 MG PO TABS
50.0000 mg | ORAL_TABLET | Freq: Four times a day (QID) | ORAL | Status: DC | PRN
  Administered 2015-11-29: 50 mg via ORAL
  Filled 2015-11-29: qty 2

## 2015-11-29 MED ORDER — CITALOPRAM HYDROBROMIDE 20 MG PO TABS
20.0000 mg | ORAL_TABLET | Freq: Every day | ORAL | Status: DC
  Administered 2015-11-29: 20 mg via ORAL
  Filled 2015-11-29 (×4): qty 1

## 2015-11-29 MED ORDER — TRAZODONE HCL 50 MG PO TABS
50.0000 mg | ORAL_TABLET | Freq: Every evening | ORAL | Status: DC | PRN
  Administered 2015-11-29: 50 mg via ORAL
  Filled 2015-11-29: qty 1

## 2015-11-29 MED ORDER — ONDANSETRON HCL 4 MG PO TABS
4.0000 mg | ORAL_TABLET | Freq: Three times a day (TID) | ORAL | Status: DC | PRN
  Administered 2015-11-29: 4 mg via ORAL
  Filled 2015-11-29: qty 1

## 2015-11-29 MED ORDER — CLONIDINE HCL 0.1 MG PO TABS: 0.1000 mg | ORAL_TABLET | ORAL | Status: DC

## 2015-11-29 MED ORDER — IBUPROFEN 400 MG PO TABS: 600.0000 mg | ORAL_TABLET | Freq: Three times a day (TID) | ORAL | Status: DC | PRN

## 2015-11-29 MED ORDER — ALUM & MAG HYDROXIDE-SIMETH 200-200-20 MG/5ML PO SUSP
30.0000 mL | ORAL | Status: DC | PRN
  Administered 2015-11-29: 30 mL via ORAL
  Filled 2015-11-29: qty 30

## 2015-11-29 NOTE — ED Triage Notes (Signed)
Pt states "I'm tired of living" and pt relates that is mother is ill and he does everything for her. Pt states he has felt this way for about 4 months, states he was going to try to take an overdose of heroin but decided against it.  Pt admits to cocaine use tonight.

## 2015-11-29 NOTE — Progress Notes (Signed)
Patient did not attend AA group meeting. 

## 2015-11-29 NOTE — ED Notes (Signed)
Called [facility] for transportation to [facility].

## 2015-11-29 NOTE — Tx Team (Signed)
Initial Interdisciplinary Treatment Plan   PATIENT STRESSORS: Financial difficulties Medication change or noncompliance   PATIENT STRENGTHS: Curator fund of knowledge   PROBLEM LIST: Problem List/Patient Goals Date to be addressed Date deferred Reason deferred Estimated date of resolution  Depression 11/29/15     Substance abuse 11/29/15     Suicidal ideation 11/29/15     "I would like to get on a routine again" 11/29/15     "I would like a different psychiatrist" 11/29/15                              DISCHARGE CRITERIA:  Improved stabilization in mood, thinking, and/or behavior Verbal commitment to aftercare and medication compliance  PRELIMINARY DISCHARGE PLAN: Outpatient therapy Medication management  PATIENT/FAMIILY INVOLVEMENT: This treatment plan has been presented to and reviewed with the patient, [place]  The patient and family have been given the opportunity to ask questions and make suggestions.  Windell Moment 11/29/2015, 6:37 PM

## 2015-11-29 NOTE — BH Assessment (Signed)
Tele Assessment Note   Larry Hamilton is an 50 y.o. male who came to the ED with issues surrounding suicidal thoughts, cocaine use and not feeling stable on his current medications. Pt was irritable during assessment and states that he is frustrated with his psychiatrist at [facility] because he only spends 15 minutes with him and doesn't give him the right medications. He states that his previous psychiatrist is retired and the medications he is currently on aren't working. He states that he has a history of Bipolar disorder and has not been sleeping. He states that he has been taking cocaine and buying seroquel on the street to "hype him up and bring him down" so he can sleep. He states that he last used cocaine last night and has been using more frequently than usual in the past 3 months. He states that he has been having suicidal thoughts for the past 3 to 4 months and bought heroin a couple of weeks ago with the intention of overdosing on it. He states that he was going to do it but he doesn't like needles and didn't think he could stick himself. He states that he is "tired of living" and he needs help. He states that his biggest stressor is taking care of his elderly mother and aunt, who he helps do everything for. They do not live with him so he has to go back and forth to help them. He states that his sister is in town but she doesn't help which makes him angry and he states that he would like to "knock her block off". He states that he would hurt her if he got the chance. He also states that he hears voices calling his name at times which he has experienced all his life. He denies visual hallucinations.   Diagnosis: Bipolar 1 Disorder, current episode unspecified, Cocaine use disorder, moderate  Past Medical History:  Past Medical History:  Diagnosis Date  . Anxiety disorder CHEST PAIN  . Arthritis   . Basal cell carcinoma of anal skin   . Bipolar disorder ([place])    [facility]  . Depression   .  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 Hyperlipidemia   . Hypertension    denies  . Internal hemorrhoid   . Kidney stones   . Migraine   . Nasal sinus congestion   . Panic attacks   . Rectum pain   . Schizophrenia [facility])     Past Surgical History:  Procedure Laterality Date  . APPENDECTOMY  AGE 69  . COLONOSCOPY  2006   Dr. Rowe Pavy: normal colonoscopy.  . COLONOSCOPY N/A 03/06/2013   Dr. Gala Romney- friable hemorrhoids;o/w negative colonoscopy  . ESOPHAGOGASTRODUODENOSCOPY (EGD) WITH ESOPHAGEAL DILATION N/A 03/06/2013   Dr. Gala Romney- erosive/ulcerative reflux esophagitis. reactive gastopathy with mild chronic inflamation on bx.  Marland Kitchen HEMORRHOID SURGERY  03/21/2012   Procedure: HEMORRHOIDECTOMY;  Surgeon: Leighton Ruff, MD;  Location: [facility];  Service: General;  Laterality: N/A;  . KNEE SURGERY    . LEFT KNEE SURGERY  AGE 89  . MOUTH SURGERY    . RECTAL BIOPSY  03/21/2012   Procedure: BIOPSY RECTAL;  Surgeon: Leighton Ruff, MD;  Location: [facility];  Service: General;  Laterality: N/A;  WIDE LOCAL EXCISON OF ANAL MASS, POSSIBLE HEMORRHOIDECTOMY  . TEETH EXTRACTIONS AND EXCISION MOUTH LESION  AGE 42  . TRANSANAL EXCISION OF RECTAL MASS  04/07/2012   Procedure: TRANSANAL EXCISION OF RECTAL MASS;  Surgeon: Leighton Ruff, MD;  [facility];  Service: General;  Laterality: N/A;  Wide Local Excision of Perianal Mass    Family History:  Family History  Problem Relation Age of Onset  . Cancer Father     Leukemia  . Colon cancer Father     35s    Social History:  reports that he has quit smoking. His smoking use included Cigarettes. His smokeless tobacco use includes Snuff and Chew. He reports that he drinks alcohol. He reports that he uses drugs, including Cocaine and Marijuana.  Additional Social History:  Alcohol / Drug Use History of alcohol / drug use?: Yes Withdrawal Symptoms:  Agitation Substance #1 Name of Substance 1: Cocaine 1 - Age of First Use: unspecified 1 - Amount (size/oz): unspecified 1 - Frequency: every other week 1 - Duration: 3 months (has been using more in the past 3 months- does have a previous history of use before this) 1 - Last Use / Amount: last night, unknown amount   CIWA: CIWA-Ar BP: 142/93 Pulse Rate: 109 COWS:    PATIENT STRENGTHS: (choose at least two) Average or above average intelligence Motivation for treatment/growth  Allergies:  Allergies  Allergen Reactions  . Rocephin [Ceftriaxone Sodium In Dextrose] Anaphylaxis    THROAT AND TONGUE  . Flomax [Tamsulosin Hcl] Rash    Home Medications:  (Not in a hospital admission)  OB/GYN Status:  No LMP for male patient.  General Assessment Data Location of Assessment: [facility] TTS Assessment: In system Is this a Tele or Face-to-Face Assessment?: Face-to-Face Is this an Initial Assessment or a Re-assessment for this encounter?: Initial Assessment Marital status: Divorced [place] name:  (NA) Is patient pregnant?: No Pregnancy Status: No Living Arrangements: Spouse/significant other Can pt return to current living arrangement?: Yes Admission Status: Voluntary Is patient capable of signing voluntary admission?: Yes Referral Source: Self/Family/Friend Insurance type: Medicaid      Crisis Care Plan Living Arrangements: Spouse/significant other Name of Psychiatrist: [facility] Name of Therapist: None  Education Status Is patient currently in school?: No Highest grade of school patient has completed: 8th   Risk to self with the past 6 months Suicidal Ideation: Yes-Currently Present Has patient been a risk to self within the past 6 months prior to admission? : Yes Suicidal Intent: Yes-Currently Present Has patient had any suicidal intent within the past 6 months prior to admission? : Yes Is patient at risk for suicide?: Yes Suicidal Plan?: Yes-Currently Present Has  patient had any suicidal plan within the past 6 months prior to admission? : Yes Specify Current Suicidal Plan: overdose on heroin Access to Means: Yes Specify Access to Suicidal Means: bought heroin What has been your use of drugs/alcohol within the last 12 months?: using cocaine, buying seroquel on the street Previous Attempts/Gestures: Yes How many times?:  (unknown) Other Self Harm Risks: Unknown Triggers for Past Attempts: Unknown Intentional Self Injurious Behavior: None Family Suicide History: No Recent stressful life event(s): Other (Comment) (taking care of his mother who is elderly, ) Persecutory voices/beliefs?: Yes Depression: Yes Depression Symptoms: Despondent, Feeling worthless/self pity, Feeling angry/irritable Substance abuse history and/or treatment for substance abuse?: Yes Suicide prevention information given to non-admitted patients: Not applicable  Risk to Others within the past 6 months Homicidal Ideation: Yes-Currently Present Does patient have any lifetime risk of violence toward others beyond the six months prior to admission? : No Thoughts of Harm to Others: Yes-Currently Present Comment -  Thoughts of Harm to Others: thoughts to harm his sister who isn't helping take care of his mother Current Homicidal Intent: No Current Homicidal Plan:  (states "I'm going to knock her block off") Access to Homicidal Means: No Identified Victim: sister History of harm to others?: No Assessment of Violence: None Noted Violent Behavior Description: none Does patient have access to weapons?: No Criminal Charges Pending?: No Is patient on probation?: No  Psychosis Hallucinations: Auditory Delusions: None noted  Mental Status Report Appearance/Hygiene: In scrubs Eye Contact: Fair Motor Activity: Freedom of movement Speech: Argumentative, Logical/coherent Level of Consciousness: Alert Mood: Angry Affect: Angry Anxiety Level: Moderate Thought Processes:  Coherent Judgement: Unimpaired Orientation: Person, Place, Time, Situation Obsessive Compulsive Thoughts/Behaviors: None  Cognitive Functioning Concentration: Normal Memory: Recent Intact, Remote Intact IQ: Average Insight: Poor Impulse Control: Poor Appetite: Fair Weight Loss: 0 Weight Gain: 0 Sleep: Decreased Total Hours of Sleep:  ("not sleeping") Vegetative Symptoms: None  ADLScreening [facility] Assessment Services) Patient's cognitive ability adequate to safely complete daily activities?: Yes Patient able to express need for assistance with ADLs?: Yes Independently performs ADLs?: Yes (appropriate for developmental age)  Prior Inpatient Therapy Prior Inpatient Therapy: Yes Prior Therapy Dates: multiple  Prior Therapy Facilty/Provider(s): [facility] Reason for Treatment: SI, SA  Prior Outpatient Therapy Prior Outpatient Therapy: Yes Prior Therapy Dates: ongoing Prior Therapy Facilty/Provider(s): [facility] Reason for Treatment: Depression Does patient have an ACCT team?: No Does patient have Intensive In-House Services?  : No Does patient have [facility] services? : No Does patient have P4CC services?: No  ADL Screening (condition at time of admission) Patient's cognitive ability adequate to safely complete daily activities?: Yes Is the patient deaf or have difficulty hearing?: No Does the patient have difficulty seeing, even when wearing glasses/contacts?: No Does the patient have difficulty concentrating, remembering, or making decisions?: No Patient able to express need for assistance with ADLs?: Yes Does the patient have difficulty dressing or bathing?: No Independently performs ADLs?: Yes (appropriate for developmental age) Does the patient have difficulty walking or climbing stairs?: No Weakness of Legs: None Weakness of Arms/Hands: None  Home Assistive Devices/Equipment Home Assistive Devices/Equipment: None  Therapy Consults (therapy consults require a  physician order) PT Evaluation Needed: No OT Evalulation Needed: No SLP Evaluation Needed: No Abuse/Neglect Assessment (Assessment to be complete while patient is alone) Physical Abuse: Denies Verbal Abuse: Yes, past (Comment) (Father was an alcoholic ) Sexual Abuse: Denies Exploitation of patient/patient's resources: Denies Self-Neglect: Denies Values / Beliefs Cultural Requests During Hospitalization: None Spiritual Requests During Hospitalization: None Consults Spiritual Care Consult Needed: No Social Work Consult Needed: No Regulatory affairs officer (For Healthcare) Does patient have an advance directive?: No Would patient like information on creating an advanced directive?: No - patient declined information Nutrition Screen- [facility] Patient's home diet: Regular Has the patient recently lost weight without trying?: No Has the patient been eating poorly because of a decreased appetite?: No Malnutrition Screening Tool Score: 0  Additional Information 1:1 In Past 12 Months?: No CIRT Risk: Yes Elopement Risk: No Does patient have medical clearance?: Yes     Disposition:  Disposition Initial Assessment Completed for this Encounter: Yes Disposition of Patient: Inpatient treatment program  Bernard Donahoo 11/29/2015 8:04 AM

## 2015-11-29 NOTE — Progress Notes (Signed)
Pt accepted to [facility] bed 306-1, attending Dr. Parke Poisson. Number to call report is [phone].

## 2015-11-29 NOTE — Progress Notes (Signed)
Larry Hamilton is a 50 y.o. male being admitted voluntarily to 306-1 from [facility].  He came to the ED with suicidal thoughts, cocaine use and not feeling stable on his current medications. He reported being frustrated with his psychiatrist at [facility] because he only spends 15 minutes with him and doesn't give him the right medications. He doesn't believe that the medications he is currently on aren't working. He reports history of Bipolar disorder and has not been sleeping. He states that he has been taking cocaine and buying seroquel on the street to "hype him up and bring him down" so he can sleep. Last use of cocaine was last night.  He states that he has been having suicidal thoughts for the past 3 to 4 months and bought heroin a couple of weeks ago with the intention of overdosing on it. He states that he was going to do it but he doesn't like needles and didn't think he could stick himself. He states that he is "tired of living" and he needs help. He states that his biggest stressor is taking care of his elderly mother and aunt.  He has a sister but she doesn't help which makes him angry.  He voiced that he would hurt her if he got the chance. He also states that he hears voices calling his name at times which he has experienced all his life. He denies visual hallucinations.  He is diagnosed with Bipolar 1 Disorder, current episode unspecified and Cocaine use disorder, moderate.  Oriented him to the unit.  Admission paperwork reviewed and signed.  Belongings searched and secured in locker 46.  Skin assessment completed and noted red skin on groin region and left surgery scar.  Q 15 minute checks initiated for safety.  We will monitor the progress towards his goals.
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
## 2015-11-29 NOTE — BHH Counselor (Signed)
Per Priscille Loveless NP pt meets inpatient criteria. EDP was notified and agrees with this decision. TTS to seek placement.   Bedelia Person, M.S., LPCA, [place], [facility] Licensed Professional Counselor Associate  Triage Specialist  [facility]  Therapeutic Triage Services Phone: [phone] Fax: [phone]

## 2015-11-29 NOTE — ED Provider Notes (Signed)
Clinical Course  Comment By Time  Labs reviewed.    Dorie Rank, MD 08/18 [phone]   Awaiting inpatient placement.  Vital signs stable.    Dorie Rank, MD 11/29/15 [phone]

## 2015-11-30 ENCOUNTER — Encounter (HOSPITAL_COMMUNITY): Payer: Self-pay | Admitting: Psychiatry

## 2015-11-30 DIAGNOSIS — F1420 Cocaine dependence, uncomplicated: Secondary | ICD-10-CM | POA: Clinically undetermined

## 2015-11-30 DIAGNOSIS — F1410 Cocaine abuse, uncomplicated: Secondary | ICD-10-CM

## 2015-11-30 DIAGNOSIS — F319 Bipolar disorder, unspecified: Principal | ICD-10-CM

## 2015-11-30 HISTORY — DX: Cocaine dependence, uncomplicated: F14.20

## 2015-11-30 LAB — COMPREHENSIVE METABOLIC PANEL
ALT: 17 U/L (ref 17–63)
ANION GAP: 9 (ref 5–15)
AST: 16 U/L (ref 15–41)
Albumin: 4.4 g/dL (ref 3.5–5.0)
Alkaline Phosphatase: 50 U/L (ref 38–126)
BUN: 16 mg/dL (ref 6–20)
CHLORIDE: 106 mmol/L (ref 101–111)
CO2: 24 mmol/L (ref 22–32)
CREATININE: 1 mg/dL (ref 0.61–1.24)
Calcium: 9.4 mg/dL (ref 8.9–10.3)
GFR calc non Af Amer: >60 mL/min (ref >60)
Glucose, Bld: 125 mg/dL — ABNORMAL HIGH (ref 65–99)
POTASSIUM: 4.5 mmol/L (ref 3.5–5.1)
SODIUM: 139 mmol/L (ref 135–145)
Total Bilirubin: 0.9 mg/dL (ref 0.3–1.2)
Total Protein: 7.5 g/dL (ref 6.5–8.1)

## 2015-11-30 LAB — LIPID PANEL
CHOL/HDL RATIO: 5.5 ratio
Cholesterol: 208 mg/dL — ABNORMAL HIGH (ref 0–200)
HDL: 38 mg/dL — AB (ref >40)
LDL CALC: 137 mg/dL — AB (ref 0–99)
TRIGLYCERIDES: 164 mg/dL — AB (ref <150)
VLDL: 33 mg/dL (ref 0–40)

## 2015-11-30 LAB — TSH: TSH: 1.967 u[IU]/mL (ref 0.350–4.500)

## 2015-11-30 MED ORDER — TRAZODONE HCL 100 MG PO TABS
100.0000 mg | ORAL_TABLET | Freq: Every evening | ORAL | Status: DC | PRN
  Administered 2015-11-30: 100 mg via ORAL
  Filled 2015-11-30: qty 1

## 2015-11-30 MED ORDER — LAMOTRIGINE 25 MG PO TABS
25.0000 mg | ORAL_TABLET | Freq: Every day | ORAL | Status: DC
  Administered 2015-11-30 – 2015-12-02 (×3): 25 mg via ORAL
  Filled 2015-11-30 (×6): qty 1

## 2015-11-30 MED ORDER — MIRTAZAPINE 15 MG PO TABS
15.0000 mg | ORAL_TABLET | Freq: Every day | ORAL | Status: DC
  Administered 2015-11-30: 15 mg via ORAL
  Filled 2015-11-30 (×4): qty 1

## 2015-11-30 MED ORDER — CHLORDIAZEPOXIDE HCL 25 MG PO CAPS
ORAL_CAPSULE | ORAL | Status: AC
  Filled 2015-11-30: qty 1

## 2015-11-30 MED ORDER — OLANZAPINE 5 MG PO TABS
5.0000 mg | ORAL_TABLET | Freq: Every day | ORAL | Status: DC
  Administered 2015-11-30: 5 mg via ORAL
  Filled 2015-11-30: qty 1
  Filled 2015-11-30: qty 2
  Filled 2015-11-30 (×2): qty 1

## 2015-11-30 MED ORDER — CHLORDIAZEPOXIDE HCL 25 MG PO CAPS
25.0000 mg | ORAL_CAPSULE | Freq: Four times a day (QID) | ORAL | Status: DC | PRN
  Administered 2015-11-30 – 2015-12-02 (×6): 25 mg via ORAL
  Filled 2015-11-30 (×5): qty 1

## 2015-11-30 NOTE — BHH Group Notes (Signed)
[facility] Group Notes:  (Nursing/MHT/Case Management/Adjunct)  Date:  11/30/2015  Time:  3:17 PM  Type of Therapy:  Psychoeducational Skills  Participation Level:  Minimal  Participation Quality:  Inattentive  Affect:  Blunted  Cognitive:  Alert  Insight:  Limited  Engagement in Group:  Limited  Modes of Intervention:  Education, Exploration and Support  Summary of Progress/Problems:attended group but participated minimally  Larry Hamilton 11/30/2015, 3:17 PM
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
## 2015-11-30 NOTE — BHH Group Notes (Signed)
[place] Group Notes:  (Clinical Social Work)   02/09/2015       Summary of Progress/Problems:   In today's process group a decisional balance exercise was used to explore in depth the perceived benefits and costs of alcohol and drugs, as well as the  benefits and costs of replacing these with healthy coping skills.  Patients listed healthy and unhealthy coping techniques, determining with CSW guidance that unhealthy coping techniques work initially, but eventually become harmful.  Motivational Interviewing and the whiteboard were utilized for the exercises.  The patient expressed that the unhealthy coping he often uses is drinking/drugs while the healthy coping includes laughter.  He did not talk a great deal in group, but when he did it was insightful and pertinent.  Type of Therapy:  Group Therapy - Process   Participation Level:  Active  Participation Quality:  Attentive and Sharing  Affect:  Blunted  Cognitive:  Appropriate  Insight:  Developing/Improving  Engagement in Therapy:  Engaged  Modes of Intervention:  Education, Motivational Interviewing  Selmer Dominion, LCSW 11/30/2015, 1:40 PM

## 2015-11-30 NOTE — Progress Notes (Signed)
Patient ID: Larry Hamilton, male   DOB: [birth year], 50 y.o.   MRN: [id number] D: Patient lying in bed awake on approach. Pt isolates mostly to room. Pt mood and affect appeared depressed and flat. Pt reports of pain but was asleep when writer checked on him. Pt denies SI/HI/AVH. Cooperative with assessment.  A: Medications administered as prescribed. Support and encouragement provided to attend groups and engage in milieu. Pt encouraged to discuss feelings and come to staff with any question or concerns.  R: Patient remains safe.

## 2015-11-30 NOTE — BHH Suicide Risk Assessment (Signed)
[facility] Admission Suicide Risk Assessment   Nursing information obtained from:  Patient Demographic factors:  Male, Caucasian, Unemployed Current Mental Status:  Self-harm thoughts Loss Factors:  Financial problems / change in socioeconomic status Historical Factors:  Family history of mental illness or substance abuse Risk Reduction Factors:  Sense of responsibility to family  Total Time spent with patient: 30 minutes Principal Problem: Bipolar 1 disorder ([place]) Diagnosis:   Patient Active Problem List   Diagnosis Date Noted  . Cocaine use disorder, moderate, dependence ([place]) [F14.20] 11/30/2015  . Bipolar 1 disorder ([place]) [F31.9] 11/29/2015  . Dysphagia, unspecified(787.20) [R13.10] 02/15/2013  . Family history of colon cancer [Z80.0] 02/15/2013  . Primary basal cell carcinoma of anus [C44.510] 03/18/2012   Subjective Data: patient states " I am bipolar , I need help, I am suicidal and decided to get help. I also feel paranoid.'   Continued Clinical Symptoms:  Alcohol Use Disorder Identification Test Final Score (AUDIT): 0 The "Alcohol Use Disorders Identification Test", Guidelines for Use in Primary Care, Second Edition.  World Pharmacologist [facility]). Score between 0-7:  no or low risk or alcohol related problems. Score between 8-15:  moderate risk of alcohol related problems. Score between 16-19:  high risk of alcohol related problems. Score 20 or above:  warrants further diagnostic evaluation for alcohol dependence and treatment.   CLINICAL FACTORS:   Bipolar Disorder:   Depressive phase Alcohol/Substance Abuse/Dependencies   Musculoskeletal: Strength & Muscle Tone: within normal limits Gait & Station: normal Patient leans: N/A  Psychiatric Specialty Exam: Physical Exam  Nursing note and vitals reviewed.   Review of Systems  Psychiatric/Behavioral: Positive for depression, hallucinations, substance abuse and suicidal ideas. The patient is nervous/anxious and has  insomnia.   All other systems reviewed and are negative.   Blood pressure 106/71, pulse 74, temperature 97.7 F (36.5 C), resp. rate 16, height 5' 5.5" (1.664 m), weight 96.2 kg (212 lb), SpO2 98 %.Body mass index is 34.74 kg/m.  General Appearance: Casual  Eye Contact:  Fair  Speech:  Clear and Coherent  Volume:  Normal  Mood:  Angry, Anxious and Depressed  Affect:  Appropriate  Thought Process:  Goal Directed and Descriptions of Associations: Intact  Orientation:  Full (Time, Place, and Person)  Thought Content:  Paranoid Ideation and Rumination  Suicidal Thoughts:  Yes.  without intent/plan  Homicidal Thoughts:  No  Memory:  Immediate;   Fair Recent;   Fair Remote;   Fair  Judgement:  Impaired  Insight:  Shallow  Psychomotor Activity:  Normal  Concentration:  Concentration: Fair and Attention Span: Fair  Recall:  [employer] of Knowledge:  Fair  Language:  Fair  Akathisia:  No  Handed:  Right  AIMS (if indicated):     Assets:  Communication Skills Desire for Improvement  ADL's:  Intact  Cognition:  WNL  Sleep:  Number of Hours: 6.5      COGNITIVE FEATURES THAT CONTRIBUTE TO RISK:  Closed-mindedness, Polarized thinking and Thought constriction (tunnel vision)    SUICIDE RISK:   Moderate:  Frequent suicidal ideation with limited intensity, and duration, some specificity in terms of plans, no associated intent, good self-control, limited dysphoria/symptomatology, some risk factors present, and identifiable protective factors, including available and accessible social support.   PLAN OF CARE: Case discussed with Aggie NP - please see H&p for plan.  I certify that inpatient services furnished can reasonably be expected to improve the patient's condition.  Tomasa Dobransky, MD 11/30/2015, 1:47 PM
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
## 2015-11-30 NOTE — Progress Notes (Signed)
Patient ID: Larry Hamilton, male   DOB: [birth year], 50 y.o.   MRN: [id number]   Pt signed a 72 hour request for discharge on 11/30/15 @ 336 pm. Benancio Deeds RN

## 2015-11-30 NOTE — H&P (Signed)
Psychiatric Admission Assessment Adult  Patient Identification: Larry Hamilton  MRN:  [id number]  Date of Evaluation:  11/30/2015  Chief Complaint: Suicidal ideations.  Principal Diagnosis:Bipolar 1 disorder current episode unspecified, Cocaine use disorder moderate   Diagnosis:   Patient Active Problem List   Diagnosis Date Noted  . Bipolar 1 disorder ([place]) [F31.9] 11/29/2015  . Dysphagia, unspecified(787.20) [R13.10] 02/15/2013  . Family history of colon cancer [Z80.0] 02/15/2013  . Primary basal cell carcinoma of anus [C44.510] 03/18/2012   History of Present Illness: This is an admission assessment for Larry Hamilton, a 50 year old Caucasian male with hx of cocaine dependence. Admitted to the [facility] adult unit with complaints of suicidal ideations & expressed not wanting to live any more.  He has hx of suicide attempt by cutting his wrist at age 21. He has had numerous psychiatric inpatient admission at [facility] over 5 times. During this assessment, he reports, "I drove myself to the [facility] on Thursday morning. I was having thoughts of suicide, feeling worthless & doing a lot of cocaine. I'm tired of living. There is not enough time in the day to do all that I got to do. I don't have any 'me' time any more. I'm currently taking care of my mother, aunt & my baby mama. There is no time for me to do any damn thing. Whenever I have any time for me, I use it to do cocaine. I use cocaine to get away from life. I have been using cocaine badly x 3 months. I use Xanax here & there. I was diagnosed with Bipolar disorder a long time ago. I take Thorazine 50 mg, Celexa 20 mg, Trazodone 300 mg. But, these medicines ain't worth a shit. I hear voices daily & I see things everyday. My mood is erratic. I get  pissed off easily. Bipolar runs in my family. Everyone in my family has it".  Associated Signs/Symptoms:  Depression Symptoms:  depressed mood, insomnia, hopelessness, anxiety,  (Hypo) Manic  Symptoms:  Irritable Mood, Labiality of Mood,  Anxiety Symptoms:  Excessive Worry,  Psychotic Symptoms:  Hallucinations: Auditory Visual  PTSD Symptoms: Denies any hx of PTSD or symptoms.  Total Time spent with patient: Greater than 30 minutes  Past Psychiatric History:   Is the patient at risk to self? No.  Has the patient been a risk to self in the past 6 months? No.  Has the patient been a risk to self within the distant past? Yes.    Is the patient a risk to others? No.  Has the patient been a risk to others in the past 6 months? No.  Has the patient been a risk to others within the distant past? No.   Prior Inpatient Therapy: Yes, ([facility] x 5) Prior Outpatient Therapy: [facility].  Alcohol Screening: 1. How often do you have a drink containing alcohol?: Never 9. Have you or someone else been injured as a result of your drinking?: No 10. Has a relative or friend or a doctor or another health worker been concerned about your drinking or suggested you cut down?: No Alcohol Use Disorder Identification Test Final Score (AUDIT): 0 Brief Intervention: AUDIT score less than 7 or less-screening does not suggest unhealthy drinking-brief intervention not indicated  Substance Abuse History in the last 12 months:  Yes.    Consequences of Substance Abuse: Medical Consequences:  Liver damage, Possible death by overdose Legal Consequences:  Arrests, jail time, Loss of driving privilege. Family Consequences:  Family discord, divorce and or separation.  Previous Psychotropic Medications: Yes (Thorazine 50 mg, Trazodone 300 mg, Citalopram 20 mg)  Psychological Evaluations: Yes   Past Medical History:  Past Medical History:  Diagnosis Date  . Anxiety disorder CHEST PAIN  . Arthritis   . Basal cell carcinoma of anal skin   . Bipolar disorder ([place])    [facility]  . Depression   . History of head injury AGE 89--  CLOSED HEAD INJURY W/ HEMATOMA AND POSSIBLE SKULL FX--  RESIDUAL  HEADACHES  . Hyperlipidemia   . Hypertension    denies  . Internal hemorrhoid   . Kidney stones   . Migraine   . Nasal sinus congestion   . Panic attacks   . Rectum pain   . Schizophrenia [facility])     Past Surgical History:  Procedure Laterality Date  . APPENDECTOMY  AGE 61  . COLONOSCOPY  2006   Dr. Rowe Pavy: normal colonoscopy.  . COLONOSCOPY N/A 03/06/2013   Dr. Gala Romney- friable hemorrhoids;o/w negative colonoscopy  . ESOPHAGOGASTRODUODENOSCOPY (EGD) WITH ESOPHAGEAL DILATION N/A 03/06/2013   Dr. Gala Romney- erosive/ulcerative reflux esophagitis. reactive gastopathy with mild chronic inflamation on bx.  Marland Kitchen HEMORRHOID SURGERY  03/21/2012   Procedure: HEMORRHOIDECTOMY;  Surgeon: Leighton Ruff, MD;  Location: [facility];  Service: General;  Laterality: N/A;  . KNEE SURGERY    . LEFT KNEE SURGERY  AGE 30  . MOUTH SURGERY    . RECTAL BIOPSY  03/21/2012   Procedure: BIOPSY RECTAL;  Surgeon: Leighton Ruff, MD;  Location: [facility];  Service: General;  Laterality: N/A;  WIDE LOCAL EXCISON OF ANAL MASS, POSSIBLE HEMORRHOIDECTOMY  . TEETH EXTRACTIONS AND EXCISION MOUTH LESION  AGE 45  . TRANSANAL EXCISION OF RECTAL MASS  04/07/2012   Procedure: TRANSANAL EXCISION OF RECTAL MASS;  Surgeon: Leighton Ruff, MD;  Location: [facility];  Service: General;  Laterality: N/A;  Wide Local Excision of Perianal Mass   Family History:  Family History  Problem Relation Age of Onset  . Cancer Father     Leukemia  . Colon cancer Father     45s   Family Psychiatric  History: Bipolar disorder: Family  Tobacco Screening: Have you used any form of tobacco in the last 30 days? (Cigarettes, Smokeless Tobacco, Cigars, and/or Pipes): Yes Tobacco use, Select all that apply: smokeless tobacco use daily Are you interested in Tobacco Cessation Medications?: Yes, will notify MD for an order Counseled patient on smoking cessation including recognizing danger situations, developing coping skills  and basic information about quitting provided: Refused/Declined practical counseling  Social History:  History  Alcohol Use  . Yes    Comment: occ     History  Drug Use  . Types: Cocaine, Marijuana    Comment: Denies     Additional Social History:  Allergies:   Allergies  Allergen Reactions  . Rocephin [Ceftriaxone Sodium In Dextrose] Anaphylaxis    THROAT AND TONGUE  . Flomax [Tamsulosin Hcl] Rash   Lab Results:  Results for orders placed or performed during the hospital encounter of 11/29/15 (from the past 48 hour(s))  Comprehensive metabolic panel     Status: Abnormal   Collection Time: 11/30/15  6:23 AM  Result Value Ref Range   Sodium 139 135 - 145 mmol/L   Potassium 4.5 3.5 - 5.1 mmol/L   Chloride 106 101 - 111 mmol/L   CO2 24 22 - 32 mmol/L   Glucose, Bld 125 (H) 65 - 99 mg/dL  BUN 16 6 - 20 mg/dL   Creatinine, Ser 1.00 0.61 - 1.24 mg/dL   Calcium 9.4 8.9 - 10.3 mg/dL   Total Protein 7.5 6.5 - 8.1 g/dL   Albumin 4.4 3.5 - 5.0 g/dL   AST 16 15 - 41 U/L   ALT 17 17 - 63 U/L   Alkaline Phosphatase 50 38 - 126 U/L   Total Bilirubin 0.9 0.3 - 1.2 mg/dL   GFR calc non Af Amer >60 >60 mL/min   GFR calc Af Amer >60 >60 mL/min    Comment: (NOTE) The eGFR has been calculated using the CKD EPI equation. This calculation has not been validated in all clinical situations. eGFR's persistently <60 mL/min signify possible Chronic Kidney Disease.    Anion gap 9 5 - 15    Comment: Performed at [facility]  TSH     Status: None   Collection Time: 11/30/15  6:23 AM  Result Value Ref Range   TSH 1.967 0.350 - 4.500 uIU/mL    Comment: Performed at [facility]   Blood Alcohol level:  Lab Results  Component Value Date   [facility] <5 11/29/2015   ETH <11 36/62/9476   Metabolic Disorder Labs:  No results found for: HGBA1C, MPG No results found for: PROLACTIN No results found for: CHOL, TRIG, HDL, CHOLHDL, VLDL, LDLCALC  Current  Medications: Current Facility-Administered Medications  Medication Dose Route Frequency Provider Last Rate Last Dose  . acetaminophen (TYLENOL) tablet 650 mg  650 mg Oral Q6H PRN Nanci Pina, FNP      . alum & mag hydroxide-simeth (MAALOX/MYLANTA) 200-200-20 MG/5ML suspension 30 mL  30 mL Oral Q4H PRN Nanci Pina, FNP      . hydrOXYzine (ATARAX/VISTARIL) tablet 25 mg  25 mg Oral TID PRN Nanci Pina, FNP   25 mg at 11/29/15 2210  . ibuprofen (ADVIL,MOTRIN) tablet 800 mg  800 mg Oral Q8H PRN Kerrie Buffalo, NP   800 mg at 11/30/15 0753  . magnesium hydroxide (MILK OF MAGNESIA) suspension 30 mL  30 mL Oral Daily PRN Nanci Pina, FNP      . methocarbamol (ROBAXIN) tablet 500 mg  500 mg Oral Q8H PRN Kerrie Buffalo, NP   500 mg at 11/29/15 1613  . nicotine (NICODERM CQ - dosed in mg/24 hours) patch 21 mg  21 mg Transdermal Daily Kerrie Buffalo, NP      . traZODone (DESYREL) tablet 50 mg  50 mg Oral QHS PRN Nanci Pina, FNP   50 mg at 11/29/15 2210   PTA Medications: Prescriptions Prior to Admission  Medication Sig Dispense Refill Last Dose  . chlorproMAZINE (THORAZINE) 50 MG tablet Take 50 mg by mouth 3 (three) times daily as needed.   Past Week at Unknown time  . citalopram (CELEXA) 20 MG tablet Take 20 mg by mouth daily.   Past Week at Unknown time  . ibuprofen (ADVIL,MOTRIN) 200 MG tablet Take 200 mg by mouth every 6 (six) hours as needed.   Past Week at Unknown time  . trazodone (DESYREL) 300 MG tablet Take 300 mg by mouth at bedtime.   Past Week at Unknown time   Musculoskeletal: Strength & Muscle Tone: within normal limits Gait & Station: normal Patient leans: N/A  Psychiatric Specialty Exam: Physical Exam  Constitutional: He appears well-developed.  HENT:  Head: Normocephalic.  Eyes: Pupils are equal, round, and reactive to light.  Neck: Normal range of motion.  Cardiovascular: Normal rate.  Respiratory: Effort normal.  GI: Soft.  Genitourinary:   Genitourinary Comments: Denies any issues in this area  Musculoskeletal: Normal range of motion.  Neurological: He is alert.  Skin: Skin is warm and dry.    Review of Systems  Constitutional: Positive for malaise/fatigue. Negative for chills and diaphoresis.  HENT: Negative.   Eyes: Negative.   Respiratory: Negative.   Cardiovascular: Negative.   Gastrointestinal: Negative.   Genitourinary: Negative.   Musculoskeletal: Negative.   Skin: Negative.   Neurological: Positive for weakness.  Endo/Heme/Allergies: Negative.   Psychiatric/Behavioral: Positive for depression, hallucinations (Auditory/visual hallucinations.) and substance abuse (Hx Polysubstance dependence). Negative for memory loss and suicidal ideas. The patient is nervous/anxious and has insomnia.     Blood pressure 106/71, pulse 74, temperature 97.7 F (36.5 C), resp. rate 16, height 5' 5.5" (1.664 m), weight 96.2 kg (212 lb), SpO2 98 %.Body mass index is 34.74 kg/m.  General Appearance: Disheveled  Eye Contact:  Fair  Speech:  Clear and Coherent  Volume:  Normal  Mood:  Depressed and Hopeless  Affect:  Depressed and Flat  Thought Process:  Coherent and Linear  Orientation:  Full (Time, Place, and Person)  Thought Content:  Hallucinations: Auditory Visual and Rumination  Suicidal Thoughts:  Currently denies any thoughts, plans or intent  Homicidal Thoughts:  Denies any thoughts, plans or intent.  Memory:  Immediate;   Good Recent;   Good Remote;   Good  Judgement:  Fair  Insight:  Fair  Psychomotor Activity:  Normal  Concentration:  Concentration: Good and Attention Span: Good  Recall:  Good  Fund of Knowledge:  Fair  Language:  Good  Akathisia:  Negative  Handed:  Right  AIMS (if indicated):     Assets:  Communication Skills Desire for Improvement  ADL's:  Intact  Cognition:  WNL  Sleep:  Number of Hours: 6.5   Treatment Plan Summary: Daily contact with patient to assess and evaluate symptoms and  progress in treatment and Medication management: 1. Admit for crisis management and stabilization, estimated length of stay 3-5 days.  2. Medication management to reduce current symptoms to base line and improve the patient's overall level of functioning  3. Treat health problems as indicated.  4. Develop treatment plan to decrease risk of relapse upon discharge and the need for readmission.  5. Psycho-social education regarding relapse prevention and self care.  6. Health care follow up as needed for medical problems.  7. Review, reconcile, and reinstate any pertinent home medications for other health issues where appropriate. 8. Call for consults with hospitalist for any additional specialty patient care services as needed.  Observation Level/Precautions:  15 minute checks  Laboratory:  Per ED, UDS + for Benzodiazepine & Cocaine.  Psychotherapy: Group sessions    Medications: See MAR   Consultations: As needed  Discharge Concerns: safety, maintaining sobriety   Estimated LOS: 2-4 days  Other: Admit to [id number]    I certify that inpatient services furnished can reasonably be expected to improve the patient's condition.    Encarnacion Slates, NP, PMHNP, FNP-BC 8/19/20179:30 AM

## 2015-11-30 NOTE — BHH Suicide Risk Assessment (Signed)
[place] INPATIENT:  Family/Significant Other Suicide Prevention Education  Suicide Prevention Education:  Patient Refusal for Family/Significant Other Suicide Prevention Education: The patient Larry Hamilton has refused to provide written consent for family/significant other to be provided Family/Significant Other Suicide Prevention Education during admission and/or prior to discharge.  Physician notified.  Brochure was reviewed with patient and given to him to keep.  Berlin Hun Grossman-Orr 11/30/2015, 3:25 PM

## 2015-12-01 DIAGNOSIS — Z79899 Other long term (current) drug therapy: Secondary | ICD-10-CM

## 2015-12-01 DIAGNOSIS — Z806 Family history of leukemia: Secondary | ICD-10-CM

## 2015-12-01 DIAGNOSIS — Z791 Long term (current) use of non-steroidal anti-inflammatories (NSAID): Secondary | ICD-10-CM

## 2015-12-01 DIAGNOSIS — Z800 Family history of malignant neoplasm of digestive organs: Secondary | ICD-10-CM

## 2015-12-01 DIAGNOSIS — R45851 Suicidal ideations: Secondary | ICD-10-CM

## 2015-12-01 DIAGNOSIS — Z811 Family history of alcohol abuse and dependence: Secondary | ICD-10-CM

## 2015-12-01 DIAGNOSIS — Z87891 Personal history of nicotine dependence: Secondary | ICD-10-CM

## 2015-12-01 DIAGNOSIS — F1220 Cannabis dependence, uncomplicated: Secondary | ICD-10-CM

## 2015-12-01 LAB — TSH: TSH: 1.37 u[IU]/mL (ref 0.350–4.500)

## 2015-12-01 LAB — LIPID PANEL
Cholesterol: 187 mg/dL (ref 0–200)
HDL: 38 mg/dL — ABNORMAL LOW (ref >40)
LDL CALC: 128 mg/dL — AB (ref 0–99)
Total CHOL/HDL Ratio: 4.9 RATIO
Triglycerides: 107 mg/dL (ref <150)
VLDL: 21 mg/dL (ref 0–40)

## 2015-12-01 LAB — HEMOGLOBIN A1C
Hgb A1c MFr Bld: 5.5 % (ref 4.8–5.6)
MEAN PLASMA GLUCOSE: 111 mg/dL

## 2015-12-01 MED ORDER — MIRTAZAPINE 7.5 MG PO TABS
7.5000 mg | ORAL_TABLET | Freq: Every day | ORAL | Status: DC
  Filled 2015-12-01 (×3): qty 1

## 2015-12-01 MED ORDER — QUETIAPINE FUMARATE 300 MG PO TABS
300.0000 mg | ORAL_TABLET | Freq: Every day | ORAL | Status: DC
  Administered 2015-12-01 – 2015-12-02 (×2): 300 mg via ORAL
  Filled 2015-12-01 (×5): qty 1

## 2015-12-01 NOTE — Progress Notes (Signed)
Pt attended evening AA group. 

## 2015-12-01 NOTE — BHH Group Notes (Signed)
[place] Group Notes:  (Nursing/MHT/Case Management/Adjunct)  Date:  12/01/2015  Time:  12:28 PM  Type of Therapy:  Psychoeducational Skills  Participation Level:  Did Not Attend  Participation Quality:  N/A  Affect:  N/A  Cognitive:  N/A  Insight:  None  Engagement in Group:  None  Modes of Intervention:  Discussion and Education  Summary of Progress/Problems: Patient was invited to group but did not attend.   Gaylan Gerold E 12/01/2015, 12:28 PM

## 2015-12-01 NOTE — BHH Group Notes (Signed)
[place] Group Notes:  (Clinical Social Work)  12/01/2015  10:00-11:00AM  Summary of Progress/Problems:   The main focus of today's process group was to process the disruptive behaviors that had been occurring on the 30 hall and creating problems with patients receiving the therapeutic care needed.  Per nursing staff, a helpful topic for group was identified as:  1)  Each patient focusing on their own recovery  2)  Treatment Agreement that each patient signed upon admission  And CSW further added the following:  1)  The importance of adding supports   2)  Possible means of dealing with the stigma about mental health issues.  At the beginning of group several patients were verbally aggressive with CSW.  These patients left the room in anger, as did several others who stated they could not handle the aggression and were going to their rooms.   The patient expressed disapproval of the actions of others who were being volatile, and proceeded to engage very well in the group topic.  He revealed his own vulnerabilities in an appropriate way and was supportive of others.  Type of Therapy:  Process Group with Motivational Interviewing  Participation Level:  Active  Participation Quality:  Attentive, Sharing and Supportive  Affect:  Blunted and Depressed  Cognitive:  Appropriate  Insight:  Engaged  Engagement in Therapy:  Engaged  Modes of Intervention:   Education, Psychiatric nurse, Activity  Selmer Dominion, LCSW 12/01/2015
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
## 2015-12-01 NOTE — Progress Notes (Signed)
Patient ID: Larry Hamilton, male   DOB: 03-14-1966, 50 y.o.   MRN: [id number]  EKG performed on patient and NP N[place] aware that per EKG report finding is abnormal.

## 2015-12-01 NOTE — Progress Notes (Signed)
D: Pt at the time of assessment endorsed moderate anxiety, depression and worrying; states, "a lot has been going on around here today; I which I was home with my family." Pt also endorsed moderate aching hip pain. Pt denied SI, HI or AVH. Pt was flat and withdrawn to self. A: Medications offered as prescribed.  Support, encouragement, and safe environment provided.  15-minute safety checks continue. R: Pt was med compliant.  Pt attended NA group. Safety checks continue

## 2015-12-01 NOTE — Progress Notes (Signed)
Patient ID: Larry Hamilton, male   DOB: 06-30-1965, 50 y.o.   MRN: [id number]  DAR: Pt. Denies SI/HI and A/V Hallucinations. He reports sleep was poor last night, appetite is fair, energy level is low, and concentration is poor. He rates depression 8/10, hopelessness 8/10, and anxiety 9/10. He has good eye contact in the morning but it turns to brief in the afternoon. He came to the window after lunch and asks, "can I get something for depression?" Writer explained that antidepressants and mood stabilizers are scheduled as opposed to as needed medications. Patient verbalized understanding and walked away from the medication window. Patient does report pain in his legs and received PRN Robaxin. Support and encouragement provided to the patient. Scheduled medications administered to patient per physician's orders. Patient is minimal but cooperative. Q15 minute checks are maintained for safety.

## 2015-12-02 DIAGNOSIS — F1420 Cocaine dependence, uncomplicated: Secondary | ICD-10-CM

## 2015-12-02 LAB — PROLACTIN
PROLACTIN: 29.9 ng/mL — AB (ref 4.0–15.2)
Prolactin: 25.5 ng/mL — ABNORMAL HIGH (ref 4.0–15.2)

## 2015-12-02 MED ORDER — QUETIAPINE FUMARATE 25 MG PO TABS: 25.0000 mg | ORAL_TABLET | Freq: Three times a day (TID) | ORAL | Status: DC | PRN

## 2015-12-02 MED ORDER — CITALOPRAM HYDROBROMIDE 10 MG PO TABS
10.0000 mg | ORAL_TABLET | Freq: Every day | ORAL | Status: DC
  Administered 2015-12-02 – 2015-12-03 (×2): 10 mg via ORAL
  Filled 2015-12-02 (×4): qty 1

## 2015-12-02 MED ORDER — ONDANSETRON 4 MG PO TBDP
4.0000 mg | ORAL_TABLET | Freq: Three times a day (TID) | ORAL | Status: DC | PRN
  Administered 2015-12-02: 4 mg via ORAL
  Filled 2015-12-02: qty 1

## 2015-12-02 NOTE — [facility] Group Notes (Signed)
[place] LCSW Group Therapy 12/02/2015  1:15 PM   Type of Therapy: Group Therapy  Participation Level: Did Not Attend. Patient invited to participate but declined.   Tilden Fossa, MSW, [place] Clinical Social Worker [facility] [phone]

## 2015-12-02 NOTE — Progress Notes (Signed)
Recreation Therapy Notes  Date: 12/02/15 Time: 0930 Location: 300 Hall Group Room  Group Topic: Stress Management  Goal Area(s) Addresses:  Patient will verbalize importance of using healthy stress management.  Patient will identify positive emotions associated with healthy stress management.   Intervention: Stress Management  Activity :  Progressive Muscle Relaxation.  LRT introduced the stress management technique of progressive muscle relaxation to the patients.  Patients were to follow along as LRT read script to engaged in the technique.  Education:  Stress Management, Discharge Planning.   Education Outcome: Acknowledges edcuation/In group clarification offered/Needs additional education  Clinical Observations/Feedback: Pt did not attend group.   Victorino Sparrow, LRT/CTRS    Ria Comment, Karl Erway A 12/02/2015 1:00 PM

## 2015-12-02 NOTE — Progress Notes (Signed)
Patient ID: Larry Hamilton, male   DOB: [birth year], 50 y.o.   MRN: [id number] [facility] MD Progress Note  12/02/2015 5:33 PM Larry Hamilton  MRN:  [id number]  Subjective:  Patient seen, chart reviewed and case discussed with nursing staff.   He states that he does not feel well due to irritability and anxiety. He states that he used to be doing well with citalopram and Seroquel. He states that he has been feeling depressed since age 36. He went to [facility] as his cocaine use has been escalating. He is hoping to be abstinent from drug use so that he can get back to his life. He talks about his son, age 60 and his girlfriend who are the reason for motivation.   He states that he has been feeling mostly depressed. He denies decreased need for sleep in the past. Denies euphoria, goal directed activity, although he did talkativeness in the past.  Principal Problem: Cocaine use disorder, moderate, dependence ([place])  Diagnosis:   Patient Active Problem List   Diagnosis Date Noted  . Cocaine use disorder, moderate, dependence ([place]) [F14.20] 11/30/2015  . Bipolar 1 disorder ([place]) [F31.9] 11/29/2015  . Dysphagia, unspecified(787.20) [R13.10] 02/15/2013  . Family history of colon cancer [Z80.0] 02/15/2013  . Primary basal cell carcinoma of anus [C44.510] 03/18/2012   Total Time spent with patient: 25 minutes  Past Psychiatric History: Bipolar affective disorder, recurrent episode  Past Medical History:  Past Medical History:  Diagnosis Date  . Anxiety disorder CHEST PAIN  . Arthritis   . Basal cell carcinoma of anal skin   . Bipolar disorder ([place])    [facility]  . Depression   . 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 Hyperlipidemia   . Hypertension    denies  . Internal hemorrhoid   . Kidney stones   . Migraine   . Nasal sinus congestion   . Panic attacks   . Rectum pain   . Schizophrenia [facility])     Past  Surgical History:  Procedure Laterality Date  . APPENDECTOMY  AGE 53  . COLONOSCOPY  2006   Dr. Rowe Pavy: normal colonoscopy.  . COLONOSCOPY N/A 03/06/2013   Dr. Gala Romney- friable hemorrhoids;o/w negative colonoscopy  . ESOPHAGOGASTRODUODENOSCOPY (EGD) WITH ESOPHAGEAL DILATION N/A 03/06/2013   Dr. Gala Romney- erosive/ulcerative reflux esophagitis. reactive gastopathy with mild chronic inflamation on bx.  Marland Kitchen HEMORRHOID SURGERY  03/21/2012   Procedure: HEMORRHOIDECTOMY;  Surgeon: Leighton Ruff, MD;  [facility]: [facility];  Service: General;  Laterality: N/A;  . KNEE SURGERY    . LEFT KNEE SURGERY  AGE 50  . MOUTH SURGERY    . RECTAL BIOPSY  03/21/2012   Procedure: BIOPSY RECTAL;  Surgeon: Leighton Ruff, MD;  Location: [facility];  Service: General;  Laterality: N/A;  WIDE LOCAL EXCISON OF ANAL MASS, POSSIBLE HEMORRHOIDECTOMY  . TEETH EXTRACTIONS AND EXCISION MOUTH LESION  AGE 41  . TRANSANAL EXCISION OF RECTAL MASS  04/07/2012   Procedure: TRANSANAL EXCISION OF RECTAL MASS;  Surgeon: Leighton Ruff, MD;  Location: [facility];  Service: General;  Laterality: N/A;  Wide Local Excision of Perianal Mass   Family History:  Family History  Problem Relation Age of Onset  . Cancer Father     Leukemia  . Colon cancer Father     73s  . Alcoholism Father   . Alcoholism Brother  Family Psychiatric  History: See H&P  Social History:  History  Alcohol Use  . Yes    Comment: occ     History  Drug Use  . Types: Cocaine, Marijuana    Comment: Denies     Social History   Social History  . Marital status: Single    Spouse name: N/A  . Number of children: N/A  . Years of education: N/A   Social History Main Topics  . Smoking status: Former Smoker    Types: Cigarettes  . Smokeless tobacco: Current User    Types: Snuff, Chew     Comment: DIP AND CHEW TOBACCO FOR 40 YRS/ SMOKING UNTIL AGE 3 APPROX. >5 YRS  (PER PT DENIES SMOKING AT THIS TIME)  . Alcohol use Yes      Comment: occ  . Drug use:     Types: Cocaine, Marijuana     Comment: Denies   . Sexual activity: Not Asked   Other Topics Concern  . None   Social History Narrative  . None   Additional Social History:   Sleep: 25 minutes  Appetite:  Fair  Current Medications: Current Facility-Administered Medications  Medication Dose Route Frequency Provider Last Rate Last Dose  . acetaminophen (TYLENOL) tablet 650 mg  650 mg Oral Q6H PRN Nanci Pina, FNP      . alum & mag hydroxide-simeth (MAALOX/MYLANTA) 200-200-20 MG/5ML suspension 30 mL  30 mL Oral Q4H PRN Nanci Pina, FNP      . chlordiazePOXIDE (LIBRIUM) capsule 25 mg  25 mg Oral QID PRN Encarnacion Slates, NP   25 mg at 12/02/15 1542  . citalopram (CELEXA) tablet 10 mg  10 mg Oral Daily Norman Clay, MD   10 mg at 12/02/15 1725  . hydrOXYzine (ATARAX/VISTARIL) tablet 25 mg  25 mg Oral TID PRN Nanci Pina, FNP   25 mg at 12/01/15 2133  . ibuprofen (ADVIL,MOTRIN) tablet 800 mg  800 mg Oral Q8H PRN Kerrie Buffalo, NP   800 mg at 12/02/15 1542  . magnesium hydroxide (MILK OF MAGNESIA) suspension 30 mL  30 mL Oral Daily PRN Nanci Pina, FNP      . methocarbamol (ROBAXIN) tablet 500 mg  500 mg Oral Q8H PRN Kerrie Buffalo, NP   500 mg at 12/02/15 0750  . nicotine (NICODERM CQ - dosed in mg/24 hours) patch 21 mg  21 mg Transdermal Daily Kerrie Buffalo, NP      . ondansetron (ZOFRAN-ODT) disintegrating tablet 4 mg  4 mg Oral Q8H PRN Jenne Campus, MD   4 mg at 12/02/15 1543  . QUEtiapine (SEROQUEL) tablet 25 mg  25 mg Oral Q8H PRN Norman Clay, MD      . QUEtiapine (SEROQUEL) tablet 300 mg  300 mg Oral QHS Ursula Alert, MD   300 mg at 12/01/15 2133    Lab Results:  Results for orders placed or performed during the hospital encounter of 11/29/15 (from the past 48 hour(s))  Lipid panel     Status: Abnormal   Collection Time: 12/01/15  6:27 AM  Result Value Ref Range   Cholesterol 187 0 - 200 mg/dL   Triglycerides 107 <150 mg/dL    HDL 38 (L) >40 mg/dL   Total CHOL/HDL Ratio 4.9 RATIO   VLDL 21 0 - 40 mg/dL   LDL Cholesterol 128 (H) 0 - 99 mg/dL    Comment:        Total Cholesterol/HDL:CHD Risk Coronary Heart Disease Risk  Table                     Men   Women  1/2 Average Risk   3.4   3.3  Average Risk       5.0   4.4  2 X Average Risk   9.6   7.1  3 X Average Risk  23.4   11.0        Use the calculated Patient Ratio above and the CHD Risk Table to determine the patient's CHD Risk.        ATP III CLASSIFICATION (LDL):  <100     mg/dL   Optimal  100-129  mg/dL   Near or Above                    Optimal  130-159  mg/dL   Borderline  160-189  mg/dL   High  >190     mg/dL   Very High Performed at [facility]   TSH     Status: None   Collection Time: 12/01/15  6:27 AM  Result Value Ref Range   TSH 1.370 0.350 - 4.500 uIU/mL    Comment: Performed at [facility]  Prolactin     Status: Abnormal   Collection Time: 12/01/15  6:27 AM  Result Value Ref Range   Prolactin 25.5 (H) 4.0 - 15.2 ng/mL    Comment: (NOTE) Performed At: [facility] [place], [place] [id number] Lindon Romp MD [id number] Performed at [facility]    Blood Alcohol level:  Lab Results  Component Value Date   [facility] <5 11/29/2015   ETH <11 [id number]   Metabolic Disorder Labs: Lab Results  Component Value Date   HGBA1C 5.5 11/30/2015   MPG 111 11/30/2015   Lab Results  Component Value Date   PROLACTIN 25.5 (H) 12/01/2015   PROLACTIN 29.9 (H) 11/30/2015   Lab Results  Component Value Date   CHOL 187 12/01/2015   TRIG 107 12/01/2015   HDL 38 (L) 12/01/2015   CHOLHDL 4.9 12/01/2015   VLDL 21 12/01/2015   LDLCALC 128 (H) 12/01/2015   LDLCALC 137 (H) 11/30/2015   Physical Findings:  AIMS: Facial and Oral Movements Muscles of Facial Expression: None, normal Lips and Perioral Area: None, normal Jaw: None, normal Tongue: None, normal,Extremity  Movements Upper (arms, wrists, hands, fingers): None, normal Lower (legs, knees, ankles, toes): None, normal, Trunk Movements Neck, shoulders, hips: None, normal, Overall Severity Severity of abnormal movements (highest score from questions above): None, normal Incapacitation due to abnormal movements: None, normal Patient's awareness of abnormal movements (rate only patient's report): No Awareness, Dental Status Current problems with teeth and/or dentures?: No Does patient usually wear dentures?: No  CIWA:    COWS:     Musculoskeletal: Strength & Muscle Tone: within normal limits Gait & Station: normal Patient leans: N/A  Psychiatric Specialty Exam: Physical Exam  Constitutional: He appears well-developed.  Obese  HENT:  Head: Normocephalic.  Eyes: Pupils are equal, round, and reactive to light.  Cardiovascular: Normal rate.   Respiratory: Effort normal.    Review of Systems  Constitutional: Negative.   Eyes: Negative.   Respiratory: Negative.   Cardiovascular: Negative.   Skin: Negative.   Neurological: Positive for headaches.  Psychiatric/Behavioral: Positive for depression. Negative for suicidal ideas. The patient is nervous/anxious and has insomnia.     Blood pressure 92/64, pulse 85, temperature 98.4 F (36.9 C),  temperature source Oral, resp. rate 20, height 5' 5.5" (1.664 m), weight 212 lb (96.2 kg), SpO2 98 %.Body mass index is 34.74 kg/m.  General Appearance: Casual  Eye Contact:  Fair  Speech:  Clear and Coherent  Volume:  Normal  Mood:  anxious  Affect:  Initially Irritable, later calm and appropriate   Thought Process:  Goal Directed and Descriptions of Associations: Intact  Orientation:  Full (Time, Place, and Person)  Thought Content:  denies paranoia  Suicidal Thoughts:   denies SI  Homicidal Thoughts:  No  Memory:  Immediate;   Fair Recent;   Fair Remote;   Fair  Judgement: fair  Insight:  Shallow  Psychomotor Activity:  Normal  Concentration:   Concentration: Fair and Attention Span: Fair  Recall:  [employer] of Knowledge:  Fair  Language:  Fair  Akathisia:  No  Handed:  Right  AIMS (if indicated):     Assets:  Communication Skills Desire for Improvement  ADL's:  Intact  Cognition:  WNL  Sleep:  Number of Hours: 2.5     Assessment: Elston is a 50 year old Caucasian male with hx of cocaine dependence. Admitted to the [facility] adult unit with complaints of suicidal ideations & expressed not wanting to live any more.  He has hx of suicide attempt by cutting his wrist at age 61. He has had numerous psychiatric inpatient admission at [facility] over 5 times in last few years.  # MDD  # r/o substance induced mood disorder Patient reports continued anxiety and irritability; will start citalopram with augmentation with quetiapine. Will discontinue Lamictal and mirtazapine to avoid polypharmacy. Noted that he denies significant manic episode in the past; will continue to monitor.   # Cocaine use disorder Patient is at motivated for sobriety. Will continue motivational interview.   Treatment Plan Summary: - Daily contact with patient to assess and evaluate symptoms and progress in treatment and Medication management: - Start citalopram 10 mg daily - Continue quetiapine 300 mg qhs - Start quetiapine 25 mg qh8hprn for anxiety, irritability - Discontinue mirtazapine, Lamictal - Continue CIWA with librium prn - Continue 15 minutes observation for safety concerns - Encouraged to participate in milieu therapy and group therapy counseling sessions and also work with coping skills -  Develop treatment plan to decrease risk of relapse upon discharge and to reduce the need for readmission. -  Psycho-social education regarding relapse prevention and self care. - Health care follow up as needed for medical problems. - Restart home medications where appropriate.  Norman Clay, MD, 12/02/2015, 5:33 PM

## 2015-12-02 NOTE — Progress Notes (Signed)
D: Patient pleasant and cooperative with care this evening. Pt visible in the milieu. Pt voices concerns about not being able to find a good primary Psychiatrist in his home area and states he is feeling very frustrated with the continuity of care. Pt states that his doctor had just recently retired and that he felt comfortable with him. Pt denies SI/HI or plans to harm himself at this time. A: Q 15 minute safety checks, encourage staff/peer interaction and group participation. Administer medications as ordered. R: Pt compliant with HS medications and attended AA group meeting this evening. No s/s of distress noted.

## 2015-12-02 NOTE — Progress Notes (Signed)
Patient ID: Larry Hamilton, male   DOB: 04-06-1966, 50 y.o.   MRN: [id number]  DAR: Pt. Denies SI/HI and A/V Hallucinations. He reports sleep is poor, appetite is good, energy level is low, and concentration is good. He rates depression 8/10, hopelessness 7/10, and anxiety 9/10. Patient received PRN Librium for anxiety. Patient does report pain and is receiving medication for this. Support and encouragement provided to the patient. Scheduled medications administered to patient per physician's orders. Patient is minimal and isolative but no behavioral issues noted. Q15 minute checks are maintained for safety.

## 2015-12-03 LAB — HEMOGLOBIN A1C
Hgb A1c MFr Bld: 5.3 % (ref 4.8–5.6)
Mean Plasma Glucose: 105 mg/dL

## 2015-12-03 MED ORDER — CITALOPRAM HYDROBROMIDE 10 MG PO TABS: 10.0000 mg | ORAL_TABLET | Freq: Every day | ORAL | 0 refills | Status: DC

## 2015-12-03 MED ORDER — QUETIAPINE FUMARATE 25 MG PO TABS: 25.0000 mg | ORAL_TABLET | Freq: Three times a day (TID) | ORAL | 0 refills | Status: DC | PRN

## 2015-12-03 MED ORDER — METHOCARBAMOL 500 MG PO TABS: 500.0000 mg | ORAL_TABLET | Freq: Three times a day (TID) | ORAL | 0 refills | Status: DC | PRN

## 2015-12-03 MED ORDER — QUETIAPINE FUMARATE 300 MG PO TABS: 300.0000 mg | ORAL_TABLET | Freq: Every day | ORAL | 0 refills | Status: DC

## 2015-12-03 MED ORDER — HYDROXYZINE HCL 25 MG PO TABS: 25.0000 mg | ORAL_TABLET | Freq: Three times a day (TID) | ORAL | 0 refills | Status: DC | PRN

## 2015-12-03 MED ORDER — NICOTINE 21 MG/24HR TD PT24: 21.0000 mg | MEDICATED_PATCH | Freq: Every day | TRANSDERMAL | 0 refills | Status: DC

## 2015-12-03 MED ORDER — IBUPROFEN 800 MG PO TABS: 800.0000 mg | ORAL_TABLET | Freq: Three times a day (TID) | ORAL | 0 refills | Status: DC | PRN

## 2015-12-03 NOTE — BHH Suicide Risk Assessment (Signed)
[facility] Discharge Suicide Risk Assessment   Principal Problem: Cocaine use disorder, moderate, dependence ([place]) Discharge Diagnoses:  Patient Active Problem List   Diagnosis Date Noted  . Cocaine use disorder, moderate, dependence ([place]) [F14.20] 11/30/2015  . Bipolar 1 disorder ([place]) [F31.9] 11/29/2015  . Dysphagia, unspecified(787.20) [R13.10] 02/15/2013  . Family history of colon cancer [Z80.0] 02/15/2013  . Primary basal cell carcinoma of anus [C44.510] 03/18/2012    Total Time spent with patient: 20 minutes  Musculoskeletal: Strength & Muscle Tone: within normal limits Gait & Station: normal Patient leans: N/A  Psychiatric Specialty Exam: Review of Systems  Psychiatric/Behavioral: Positive for depression and substance abuse.  All other systems reviewed and are negative.   Blood pressure 110/61, pulse 86, temperature 99 F (37.2 C), temperature source Oral, resp. rate 16, height 5' 5.5" (1.664 m), weight 212 lb (96.2 kg), SpO2 98 %.Body mass index is 34.74 kg/m.  General Appearance: Casual  Eye Contact::  Minimal  Speech:  Normal Rate409  Volume:  Normal  Mood:  Irritable  Affect:  irritable  Thought Process:  Coherent and Goal Directed  Orientation:  Full (Time, Place, and Person)  Thought Content:  Logical  Suicidal Thoughts:  No  Homicidal Thoughts:  No  Memory:  intact  Judgement:  Fair  Insight:  Present  Psychomotor Activity:  Normal  Concentration:  Good  Recall:  Good  Fund of Knowledge:Good  Language: Good  Akathisia:  No  Handed:  Right  AIMS (if indicated):     Assets:  Communication Skills Desire for Improvement  Sleep:  Number of Hours: 6.5  Cognition: WNL  ADL's:  Intact   Mental Status Per Nursing Assessment::   On Admission:  Self-harm thoughts  Demographic Factors:  Male, Divorced or widowed, Caucasian and Unemployed  Loss Factors: NA  Historical Factors: Family history of mental illness or substance abuse Unknown family  history  Risk Reduction Factors:   Responsible for children under 43 years of age, Sense of responsibility to family and Religious beliefs about death  Continued Clinical Symptoms:  Alcohol/Substance Abuse/Dependencies  Cognitive Features That Contribute To Risk:  Closed-mindedness and Polarized thinking    Suicide Risk:  Mild:  Suicidal ideation of limited frequency, intensity, duration, and specificity.  There are no identifiable plans, no associated intent, mild dysphoria and related symptoms, good self-control (both objective and subjective assessment), few other risk factors, and identifiable protective factors, including available and accessible social support.    Follow-up Information    [facility] .   Why:  Referral made 12/02/15. Please contact admissions coordinator every 3-4 days regarding bed availability.  Contact information: [place]  [place] .   Why:  Referral made 8/22. Appt needed before d/c Contact information: [place]. [place] [address] [phone]         He will be discharged to his girlfriend's house. Noted that he signed 72 hour paper thinking that he would be able to directly go to [facility]. Explained the rationale of 72 hour paper; he agrees to be discharged today. He is motivated for sobriety, and denies SI/HI.    Plan Of Care/Follow-up recommendations:  Activity:  full Diet:  regular Tests:  none Other:  follow up as above  Norman Clay, MD 12/03/2015, 12:01 PM

## 2015-12-03 NOTE — Progress Notes (Signed)
Patient ID: Larry Hamilton, male   DOB: 25-Mar-1966, 50 y.o.   MRN: [id number] Patient discharged to home/selfcare.  Patient was escorted home by medicare transport.  Patient acknowledged understanding of discharge instructions and receipt of all of his belongings.  Patient was able to contract for safety upon discharge.

## 2015-12-03 NOTE — Tx Team (Signed)
Interdisciplinary Treatment Plan Update (Adult) Date: 12/03/2015   Time Reviewed: 9:30 AM  Progress in Treatment: Attending groups: Intermittently Participating in groups: Minimally Taking medication as prescribed: Yes Tolerating medication: Yes Family/Significant other contact made: No, patient has declined collateral contact Patient understands diagnosis: Yes Discussing patient identified problems/goals with staff: Yes Medical problems stabilized or resolved: Yes Denies suicidal/homicidal ideation: Yes Issues/concerns per patient self-inventory: Yes Other:  New problem(s) identified: N/A  Discharge Plan or Barriers: Home with outpatient services.     Reason for Continuation of Hospitalization:  Depression Anxiety Medication Stabilization   Comments: N/A  Estimated length of stay: Discharge anticipated for today 12/03/15  Patient is a 50yo male admitted to the hospital with suicidal thoughts, cocaine use and not feeling stable on his current medications and reports primary trigger for admission was frustration with [facility] psychiatrist's treatment of his Bipolar disorder.  He has been using powder and crack cocaine, and buying Seroquel off the street for sleep.   He bought heroin a couple of weeks ago with the intention of overdosing but doesn't like needles.  He is unemployed, taking care of mother and aunt, not getting help and is very stressed.  Patient will benefit from crisis stabilization, medication evaluation, group therapy and psychoeducation, in addition to case management for discharge planning. At discharge it is recommended that Patient adhere to the established discharge plan and continue in treatment.   Review of initial/current patient goals per problem list:  1. Goal(s): Patient will participate in aftercare plan   Met: Yes   Target date: 3-5 days post admission date   As evidenced by: Patient will participate within aftercare plan AEB aftercare provider  and housing plan at discharge being identified.   8/22: Goal met. Patient plans to return home to follow up with outpatient services.    2. Goal (s): Patient will exhibit decreased depressive symptoms and suicidal ideations.   Met: Adequate for discharge per MD   Target date: 3-5 days post admission date   As evidenced by: Patient will utilize self rating of depression at 3 or below and demonstrate decreased signs of depression or be deemed stable for discharge by MD.  8/22: Adequate for discharge per MD. Patient reports improvement in her symptoms and reports feeling safe for discharge.    3. Goal(s): Patient will demonstrate decreased signs and symptoms of anxiety.   Met: Adequate for discharge per MD   Target date: 3-5 days post admission date   As evidenced by: Patient will utilize self rating of anxiety at 3 or below and demonstrated decreased signs of anxiety, or be deemed stable for discharge by MD  8/22: Adequate for discharge per MD. Patient reports improvement in her symptoms and reports feeling safe for discharge.     4. Goal(s): Patient will demonstrate decreased signs of withdrawal due to substance abuse   Met: Yes   Target date: 3-5 days post admission date   As evidenced by: Patient will produce a CIWA/COWS score of 0, have stable vitals signs, and no symptoms of withdrawal  8/22: Goal met. No withdrawal symptoms reported at this time per medical chart.   Attendees: Patient:    Family:    Physician: Dr. Modesta Messing 12/03/2015 9:30 AM  Nursing: Elesa Massed, Eulogio Bear , RN 12/03/2015 9:30 AM  Clinical Social Worker: Tilden Fossa, LCSW 12/03/2015 9:30 AM  Other: Peri Maris, LCSW 12/03/2015 9:30 AM  Other:  12/03/2015 9:30 AM  Other:  12/03/2015 9:30 AM  Other:  May  Dot Been Farmer, NP 12/03/2015 9:30 AM  Other:      Scribe for Treatment Team:  Tilden Fossa, [place]

## 2015-12-03 NOTE — Discharge Summary (Signed)
Physician Discharge Summary Note  Patient:  Larry Hamilton is an 50 y.o., male MRN:  [id number] DOB:  Jul 08, 1965 Patient phone:  [phone] (home)  Patient address:   [place] [address],  Total Time spent with patient: Greater than 30 minutes  Date of Admission:  11/29/2015  Date of Discharge: 12-03-15  Reason for Admission: Suicidal ideations.  Principal Problem: Cocaine use disorder, moderate, dependence ([place])  Discharge Diagnoses: Patient Active Problem List   Diagnosis Date Noted  . Cocaine use disorder, moderate, dependence ([place]) [F14.20] 11/30/2015  . Bipolar 1 disorder ([place]) [F31.9] 11/29/2015  . Dysphagia, unspecified(787.20) [R13.10] 02/15/2013  . Family history of colon cancer [Z80.0] 02/15/2013  . Primary basal cell carcinoma of anus [C44.510] 03/18/2012   Past Psychiatric History: Hx. Cocaine dependence  Past Medical History:  Past Medical History:  Diagnosis Date  . Anxiety disorder CHEST PAIN  . Arthritis   . Basal cell carcinoma of anal skin   . Bipolar disorder ([place])    [facility]  . Depression   . 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 Hyperlipidemia   . Hypertension    denies  . Internal hemorrhoid   . Kidney stones   . Migraine   . Nasal sinus congestion   . Panic attacks   . Rectum pain   . Schizophrenia [facility])     Past Surgical History:  Procedure Laterality Date  . APPENDECTOMY  AGE 74  . COLONOSCOPY  2006   Dr. Rowe Pavy: normal colonoscopy.  . COLONOSCOPY N/A 03/06/2013   Dr. Gala Romney- friable hemorrhoids;o/w negative colonoscopy  . ESOPHAGOGASTRODUODENOSCOPY (EGD) WITH ESOPHAGEAL DILATION N/A 03/06/2013   Dr. Gala Romney- erosive/ulcerative reflux esophagitis. reactive gastopathy with mild chronic inflamation on bx.  Marland Kitchen HEMORRHOID SURGERY  03/21/2012   Procedure: HEMORRHOIDECTOMY;  Surgeon: Leighton Ruff, MD;  Location[facility];   Service: General;  Laterality: N/A;  . KNEE SURGERY    . LEFT KNEE SURGERY  AGE 81  . MOUTH SURGERY    . RECTAL BIOPSY  03/21/2012   Procedure: BIOPSY RECTAL;  Surgeon: Leighton Ruff, MD;  Location[facility];  Service: General;  Laterality: N/A;  WIDE LOCAL EXCISON OF ANAL MASS, POSSIBLE HEMORRHOIDECTOMY  . TEETH EXTRACTIONS AND EXCISION MOUTH LESION  AGE 17  . TRANSANAL EXCISION OF RECTAL MASS  04/07/2012   Procedure: TRANSANAL EXCISION OF RECTAL MASS;  Surgeon: Leighton Ruff, MD;  Location: [facility];  Service: General;  Laterality: N/A;  Wide Local Excision of Perianal Mass   Family History:  Family History  Problem Relation Age of Onset  . Cancer Father     Leukemia  . Colon cancer Father     11s  . Alcoholism Father   . Alcoholism Brother    Family Psychiatric  History: See Md's SRA  Social History:  History  Alcohol Use  . Yes    Comment: occ     History  Drug Use  . Types: Cocaine, Marijuana    Comment: Denies     Social History   Social History  . Marital status: Single    Spouse name: N/A  . Number of children: N/A  . Years of education: N/A   Social History Main Topics  . Smoking status: Former Smoker    Types: Cigarettes  . Smokeless tobacco: Current User    Types: Snuff, Chew  Comment: DIP AND CHEW TOBACCO FOR 40 YRS/ SMOKING UNTIL AGE 56 APPROX. >5 YRS  (PER PT DENIES SMOKING AT THIS TIME)  . Alcohol use Yes     Comment: occ  . Drug use:     Types: Cocaine, Marijuana     Comment: Denies   . Sexual activity: Not Asked   Other Topics Concern  . None   Social History Narrative  . None   Hospital Course:This is an admission assessment for Cha, a 50 year old Caucasian male with hx of cocaine dependence. Admitted to the [facility] adult unit with complaints of suicidal ideations & expressed not wanting to live any more.  He has hx of suicide attempt by cutting his wrist at age 50. He has had numerous psychiatric inpatient admission at  [facility] over 5 times. During this assessment, he reports, "I drove myself to the [facility] on Thursday morning. I was having thoughts of suicide, feeling worthless & doing a lot of cocaine. I'm tired of living. There is not enough time in the day to do all that I got to do. I don't have any 'me' time any more. I'm currently taking care of my mother, aunt & my baby mama. There is no time for me to do any damn thing. Whenever I have any time for me, I use it to do cocaine. I use cocaine to get away from life. I have been using cocaine badly x 3 months. I use Xanax here & there. I was diagnosed with Bipolar disorder a long time ago. I take Thorazine 50 mg, Celexa 20 mg, Trazodone 300 mg. But, these medicines ain't worth a shit. I hear voices daily & I see things everyday. My mood is erratic. I get  pissed off easily. Bipolar runs in my family. Everyone in my family has it".   Eleno was admitted to the [facility] adult unit for worsening symptoms of Bipolar disorder, most recent episode, depressed. He reported on admission other stressors in his life such as being the sole care taker of his ailing mother, aunt & other family members. He reported episodes of erratic mood & getting pissed off easily. He reported increased use of cocaine every chance he gets to get his mind away from his stress. Shandy is in need of mood stabilization treatments.   After evaluation of his symptoms, Erhardt was medicated & discharged on; Citalopram 10 mg for depression, Lamictal 25 mg for mood stabilization, Mirtazapine 7.5 mg for insomnia, Nicotine patch 21 mg for smoking cessation & Seroquel 300 mg & 25 mg respectively for agitation/mood control. His other pre-existing medical problems were identified & treated adequately. Demetrius tolerated his treatment regimen without any adverse effects or reactions reported.  During the course of his hospital, Deforest's progress was monitored by observation & his daily report of symptom  reduction noted. His emotional & mental status were assessed & monitored by daily self-inventory reports completed by him & the clinical staff. He was also evaluated daily by the treatment team for mood stability and plans for continued recovery after discharge. Abrahim's motivation was an integral factor his mood stability. He was offered further treatment options upon discharge on outpatient basis as listed below. He was provided with all the necessary information needed to make this appointment without problems.   Upon discharge, he was both mentally & medically stable. He is adamntly denying suicidal/homicidal ideation, auditory/visual/tactile hallucinations, delusional thoughts & or paranoia. Jurgen left [facility] with all personal belongings in no apparent  distress. Transportation per [employer] transportation arrangement..  Physical Findings: AIMS: Facial and Oral Movements Muscles of Facial Expression: None, normal Lips and Perioral Area: None, normal Jaw: None, normal Tongue: None, normal,Extremity Movements Upper (arms, wrists, hands, fingers): None, normal Lower (legs, knees, ankles, toes): None, normal, Trunk Movements Neck, shoulders, hips: None, normal, Overall Severity Severity of abnormal movements (highest score from questions above): None, normal Incapacitation due to abnormal movements: None, normal Patient's awareness of abnormal movements (rate only patient's report): No Awareness, Dental Status Current problems with teeth and/or dentures?: No Does patient usually wear dentures?: No  CIWA:    COWS:     Musculoskeletal: Strength & Muscle Tone: within normal limits Gait & Station: normal Patient leans: N/A  Psychiatric Specialty Exam: Physical Exam  Constitutional: He appears well-developed.  HENT:  Head: Normocephalic.  Eyes: Pupils are equal, round, and reactive to light.  Neck: Normal range of motion.  Cardiovascular: Normal rate.   Respiratory: Effort normal.  GI:  Soft.  Genitourinary:  Genitourinary Comments: Denies any issues  Musculoskeletal: Normal range of motion.  Neurological: He is alert.  Skin: Skin is warm.    Review of Systems  Constitutional: Negative.   HENT: Negative.   Eyes: Negative.   Respiratory: Negative.   Cardiovascular: Negative.   Gastrointestinal: Negative.   Genitourinary: Negative.   Musculoskeletal: Negative.   Skin: Negative.   Neurological: Negative.   Endo/Heme/Allergies: Negative.   Psychiatric/Behavioral: Positive for depression (Stable), hallucinations (Hx hallucinations) and substance abuse (Hx Cocaine dependence). Negative for memory loss and suicidal ideas. The patient has insomnia (Stable). The patient is not nervous/anxious.     Blood pressure 110/61, pulse 86, temperature 99 F (37.2 C), temperature source Oral, resp. rate 16, height 5' 5.5" (1.664 m), weight 96.2 kg (212 lb), SpO2 98 %.Body mass index is 34.74 kg/m.  See Md's SRA   Have you used any form of tobacco in the last 30 days? (Cigarettes, Smokeless Tobacco, Cigars, and/or Pipes): Yes  Has this patient used any form of tobacco in the last 30 days? (Cigarettes, Smokeless Tobacco, Cigars, and/or Pipes):Yes, provided with nicotine patch prescription.  Blood Alcohol level:  Lab Results  Component Value Date   [facility] <5 11/29/2015   ETH <11 [id number]   Metabolic Disorder Labs:  Lab Results  Component Value Date   HGBA1C 5.3 12/01/2015   MPG 105 12/01/2015   MPG 111 11/30/2015   Lab Results  Component Value Date   PROLACTIN 25.5 (H) 12/01/2015   PROLACTIN 29.9 (H) 11/30/2015   Lab Results  Component Value Date   CHOL 187 12/01/2015   TRIG 107 12/01/2015   HDL 38 (L) 12/01/2015   CHOLHDL 4.9 12/01/2015   VLDL 21 12/01/2015   LDLCALC 128 (H) 12/01/2015   LDLCALC 137 (H) 11/30/2015   See Psychiatric Specialty Exam and Suicide Risk Assessment completed by Attending Physician prior to discharge.  Discharge destination:  Home  Is  patient on multiple antipsychotic therapies at discharge:  No   Has Patient had three or more failed trials of antipsychotic monotherapy by history:  No  Recommended Plan for Multiple Antipsychotic Therapies: NA    Medication List    STOP taking these medications   chlorproMAZINE 50 MG tablet Commonly known as:  THORAZINE   trazodone 300 MG tablet Commonly known as:  DESYREL     TAKE these medications     Indication  citalopram 10 MG tablet Commonly known as:  CELEXA Take 1 tablet (10 mg total) by  mouth daily. For depression What changed:  medication strength  how much to take  additional instructions  Indication:  Depression   hydrOXYzine 25 MG tablet Commonly known as:  ATARAX/VISTARIL Take 1 tablet (25 mg total) by mouth 3 (three) times daily as needed for anxiety.  Indication:  Anxiety Neurosis, Sedation   ibuprofen 800 MG tablet Commonly known as:  ADVIL,MOTRIN Take 1 tablet (800 mg total) by mouth every 8 (eight) hours as needed for headache or moderate pain. What changed:  medication strength  how much to take  when to take this  reasons to take this  Indication:  Moderate pain   methocarbamol 500 MG tablet Commonly known as:  ROBAXIN Take 1 tablet (500 mg total) by mouth every 8 (eight) hours as needed for muscle spasms (moderate pain).  Indication:  Musculoskeletal Pain   nicotine 21 mg/24hr patch Commonly known as:  NICODERM CQ - dosed in mg/24 hours Place 1 patch (21 mg total) onto the skin daily. For smoking cessation  Indication:  Nicotine Addiction   QUEtiapine 300 MG tablet Commonly known as:  SEROQUEL Take 1 tablet (300 mg total) by mouth at bedtime. For mood control  Indication:  Mood control   QUEtiapine 25 MG tablet Commonly known as:  SEROQUEL Take 1 tablet (25 mg total) by mouth every 8 (eight) hours as needed (anxiety, irritability).  Indication:  Agitation      Follow-up Information    [facility] .   Why:  Referral made  12/02/15. Please contact admissions coordinator every 3-4 days regarding bed availability.  Contact information: [place]  [place] Follow up on 12/23/2015.   Why:  Assessment for therapy and medication management services on Monday Sept. 11th at [place] with Coralyn Mark. Contact information: [place] [address] [phone]        Follow-up recommendations:  Activity:  As tolerated Diet: As recommended by your primary care doctor. Keep all scheduled follow-up appointments as recommended.  Comments: Patient is instructed prior to discharge to: Take all medications as prescribed by his/her mental healthcare provider. Report any adverse effects and or reactions from the medicines to his/her outpatient provider promptly. Patient has been instructed & cautioned: To not engage in alcohol and or illegal drug use while on prescription medicines. In the event of worsening symptoms, patient is instructed to call the crisis hotline, 911 and or go to the nearest ED for appropriate evaluation and treatment of symptoms. To follow-up with his/her primary care provider for your other medical issues, concerns and or health care needs.   Signed: Encarnacion Slates, NP, PMHNP, FNP-BC 12/04/2015, 4:13 PM

## 2015-12-03 NOTE — Progress Notes (Signed)
Medicaid transport confirmed to transport patient back home around 1:30pm. Patient and RN notified.  Larry Fossa, LCSW Clinical Social Worker [facility] [phone]

## 2015-12-04 NOTE — Progress Notes (Signed)
  [facility] Adult Case Management Discharge Plan :  Will you be returning to the same living situation after discharge:  Yes,  patient plans to return home At discharge, do you have transportation home?: Yes,  medicaid transportation Do you have the ability to pay for your medications: Yes,  patient will be provided with prescriptions at discharge  Release of information consent forms completed and in the chart;  Patient's signature needed at discharge.  Patient to Follow up at: Follow-up Information    [facility] .   Why:  Referral made 12/02/15. Please contact admissions coordinator every 3-4 days regarding bed availability.  Contact information: [place]  [place] Follow up on 12/23/2015.   Why:  Assessment for therapy and medication management services on Monday Sept. 11th at [place] with Coralyn Mark. Contact information: [place] [address] [phone]          Next level of care provider has access to [place] and Suicide Prevention discussed: Yes,  with patient  Have you used any form of tobacco in the last 30 days? (Cigarettes, Smokeless Tobacco, Cigars, and/or Pipes): Yes  Has patient been referred to the Quitline?: Patient refused referral  Patient has been referred for addiction treatment: Yes  Larry Hamilton 12/04/2015, 8:19 AM

## 2016-01-25 ENCOUNTER — Encounter (HOSPITAL_COMMUNITY): Payer: Self-pay | Admitting: *Deleted

## 2016-01-25 ENCOUNTER — Emergency Department (HOSPITAL_COMMUNITY)
Admission: EM | Admit: 2016-01-25 | Discharge: 2016-01-26 | Disposition: A | Discharge status: Home / Self Care | Payer: Medicaid Other | Attending: Emergency Medicine | Admitting: Emergency Medicine

## 2016-01-25 DIAGNOSIS — F1410 Cocaine abuse, uncomplicated: Secondary | ICD-10-CM | POA: Diagnosis not present

## 2016-01-25 DIAGNOSIS — I10 Essential (primary) hypertension: Secondary | ICD-10-CM | POA: Diagnosis not present

## 2016-01-25 DIAGNOSIS — Z87891 Personal history of nicotine dependence: Secondary | ICD-10-CM | POA: Insufficient documentation

## 2016-01-25 DIAGNOSIS — Z791 Long term (current) use of non-steroidal anti-inflammatories (NSAID): Secondary | ICD-10-CM | POA: Diagnosis not present

## 2016-01-25 DIAGNOSIS — F329 Major depressive disorder, single episode, unspecified: Secondary | ICD-10-CM | POA: Insufficient documentation

## 2016-01-25 DIAGNOSIS — F32A Depression, unspecified: Secondary | ICD-10-CM

## 2016-01-25 DIAGNOSIS — Z79899 Other long term (current) drug therapy: Secondary | ICD-10-CM | POA: Insufficient documentation

## 2016-01-25 DIAGNOSIS — Z811 Family history of alcohol abuse and dependence: Secondary | ICD-10-CM | POA: Diagnosis not present

## 2016-01-25 DIAGNOSIS — R45851 Suicidal ideations: Secondary | ICD-10-CM | POA: Diagnosis not present

## 2016-01-25 NOTE — ED Notes (Signed)
Pt report at least 6 previous admissions to [facility] with last several years ago. He reports using drugs again, with increasing family stressors of caring for aging parents and other relatives, having to cook for them and being unhappy with his psychiatrist at [facility]. He has no therapist .

## 2016-01-25 NOTE — ED Triage Notes (Signed)
Pt with suicidal thoughts states "all my life".  Pt admits to crack and cocaine, denies ETOH use.  Last used 1500 today.  Denies HI.  Denies attempts in last 30 days.  States "they're are a million ways to die" when asked if pt has a plan.

## 2016-01-26 DIAGNOSIS — F329 Major depressive disorder, single episode, unspecified: Secondary | ICD-10-CM

## 2016-01-26 DIAGNOSIS — Z87891 Personal history of nicotine dependence: Secondary | ICD-10-CM

## 2016-01-26 DIAGNOSIS — Z806 Family history of leukemia: Secondary | ICD-10-CM

## 2016-01-26 DIAGNOSIS — Z811 Family history of alcohol abuse and dependence: Secondary | ICD-10-CM

## 2016-01-26 DIAGNOSIS — Z888 Allergy status to other drugs, medicaments and biological substances status: Secondary | ICD-10-CM

## 2016-01-26 DIAGNOSIS — R45851 Suicidal ideations: Secondary | ICD-10-CM | POA: Insufficient documentation

## 2016-01-26 DIAGNOSIS — Z79899 Other long term (current) drug therapy: Secondary | ICD-10-CM

## 2016-01-26 DIAGNOSIS — F32A Depression, unspecified: Secondary | ICD-10-CM | POA: Insufficient documentation

## 2016-01-26 DIAGNOSIS — F1410 Cocaine abuse, uncomplicated: Secondary | ICD-10-CM | POA: Insufficient documentation

## 2016-01-26 DIAGNOSIS — Z800 Family history of malignant neoplasm of digestive organs: Secondary | ICD-10-CM

## 2016-01-26 LAB — CBC WITH DIFFERENTIAL/PLATELET
Basophils Absolute: 0 10*3/uL (ref 0.0–0.1)
Basophils Relative: 0 %
EOS PCT: 1 %
Eosinophils Absolute: 0.1 10*3/uL (ref 0.0–0.7)
HCT: 42.3 % (ref 39.0–52.0)
Hemoglobin: 14.6 g/dL (ref 13.0–17.0)
LYMPHS ABS: 2.4 10*3/uL (ref 0.7–4.0)
LYMPHS PCT: 28 %
MCH: 29.4 pg (ref 26.0–34.0)
MCHC: 34.5 g/dL (ref 30.0–36.0)
MCV: 85.1 fL (ref 78.0–100.0)
MONO ABS: 0.7 10*3/uL (ref 0.1–1.0)
MONOS PCT: 8 %
Neutro Abs: 5.3 10*3/uL (ref 1.7–7.7)
Neutrophils Relative %: 63 %
PLATELETS: 236 10*3/uL (ref 150–400)
RBC: 4.97 MIL/uL (ref 4.22–5.81)
RDW: 12.9 % (ref 11.5–15.5)
WBC: 8.5 10*3/uL (ref 4.0–10.5)

## 2016-01-26 LAB — RAPID URINE DRUG SCREEN, HOSP PERFORMED
Amphetamines: NOT DETECTED
BARBITURATES: NOT DETECTED
Benzodiazepines: POSITIVE — AB
COCAINE: POSITIVE — AB
Opiates: NOT DETECTED
TETRAHYDROCANNABINOL: NOT DETECTED

## 2016-01-26 LAB — COMPREHENSIVE METABOLIC PANEL
ALK PHOS: 46 U/L (ref 38–126)
ALT: 23 U/L (ref 17–63)
AST: 16 U/L (ref 15–41)
Albumin: 4.4 g/dL (ref 3.5–5.0)
Anion gap: 7 (ref 5–15)
BUN: 9 mg/dL (ref 6–20)
CALCIUM: 9.3 mg/dL (ref 8.9–10.3)
CHLORIDE: 106 mmol/L (ref 101–111)
CO2: 25 mmol/L (ref 22–32)
CREATININE: 0.79 mg/dL (ref 0.61–1.24)
GFR calc Af Amer: >60 mL/min (ref >60)
GFR calc non Af Amer: >60 mL/min (ref >60)
GLUCOSE: 120 mg/dL — AB (ref 65–99)
Potassium: 3.5 mmol/L (ref 3.5–5.1)
SODIUM: 138 mmol/L (ref 135–145)
Total Bilirubin: 1 mg/dL (ref 0.3–1.2)
Total Protein: 7.2 g/dL (ref 6.5–8.1)

## 2016-01-26 LAB — ETHANOL: Alcohol, Ethyl (B): 7 mg/dL — ABNORMAL HIGH (ref <5)

## 2016-01-26 LAB — ACETAMINOPHEN LEVEL: Acetaminophen (Tylenol), Serum: <10 ug/mL — ABNORMAL LOW (ref 10–30)

## 2016-01-26 LAB — SALICYLATE LEVEL: Salicylate Lvl: <7 mg/dL (ref 2.8–30.0)

## 2016-01-26 MED ORDER — CHLORPROMAZINE HCL 25 MG PO TABS: 50.0000 mg | ORAL_TABLET | Freq: Every day | ORAL | Status: DC

## 2016-01-26 MED ORDER — CITALOPRAM HYDROBROMIDE 10 MG PO TABS
10.0000 mg | ORAL_TABLET | Freq: Every day | ORAL | Status: DC
  Administered 2016-01-26: 10 mg via ORAL
  Filled 2016-01-26 (×2): qty 1

## 2016-01-26 MED ORDER — ACETAMINOPHEN 325 MG PO TABS
650.0000 mg | ORAL_TABLET | ORAL | Status: DC | PRN
  Administered 2016-01-26 (×2): 650 mg via ORAL
  Filled 2016-01-26 (×2): qty 2

## 2016-01-26 MED ORDER — IBUPROFEN 400 MG PO TABS: 600.0000 mg | ORAL_TABLET | Freq: Three times a day (TID) | ORAL | Status: DC | PRN

## 2016-01-26 MED ORDER — ALUM & MAG HYDROXIDE-SIMETH 200-200-20 MG/5ML PO SUSP: 30.0000 mL | ORAL | Status: DC | PRN

## 2016-01-26 MED ORDER — NICOTINE 21 MG/24HR TD PT24: 21.0000 mg | MEDICATED_PATCH | Freq: Every day | TRANSDERMAL | Status: DC

## 2016-01-26 MED ORDER — TRAZODONE HCL 150 MG PO TABS: 600.0000 mg | ORAL_TABLET | Freq: Every day | ORAL | Status: DC

## 2016-01-26 MED ORDER — ONDANSETRON HCL 4 MG PO TABS
4.0000 mg | ORAL_TABLET | Freq: Three times a day (TID) | ORAL | Status: DC | PRN
  Administered 2016-01-26: 4 mg via ORAL
  Filled 2016-01-26: qty 1

## 2016-01-26 MED ORDER — HYDROXYZINE HCL 25 MG PO TABS
25.0000 mg | ORAL_TABLET | Freq: Three times a day (TID) | ORAL | Status: DC | PRN
Start: 2016-01-26 — End: 2016-01-26

## 2016-01-26 NOTE — Consult Note (Signed)
Telepsych Consultation   Reason for Consult: Suicidal ideations Referring Physician:  EDP Patient Identification: Larry Hamilton MRN:  [id number] Principal Diagnosis: <principal problem not specified> Diagnosis:   Patient Active Problem List   Diagnosis Date Noted  . Cocaine use disorder, moderate, dependence ([place]) [F14.20] 11/30/2015  . Bipolar 1 disorder ([place]) [F31.9] 11/29/2015  . Dysphagia, unspecified(787.20) [R13.10] 02/15/2013  . Family history of colon cancer [Z80.0] 02/15/2013  . Primary basal cell carcinoma of anus [C44.510] 03/18/2012    Total Time spent with patient: 30 minutes  Subjective:   Larry Hamilton is a 50 y.o. male patient admitted with suicidal ideations after smoking crack cocaine. Pt states .  HPI:  Larry Hamilton is an 50 y.o. male who presents to the ED voluntarily. Pt reports he has been extremely stressed having to care for his elderly mother and aunt and his sister does not help him. Pt reports he has been feeling suicidal and states he has been feeling this way for years. Pt was asked if he has a plan to commit suicide and pt stated "there are a million ways to die, you can take pills, hang yourself, drive off a cliff, drink yourself to death, everyone comes to a boiling point and I'm getting close to that boiling point. Pt reports to seeing shadows and figures and states that he "hears conversations and music" in his mind. Pt reports he has been to [facility] in August for suicidal ideations in the past and states 2 or 3 of his family members committed suicide.    Pt endorses using cocaine yesterday and states he "doesn't use it that often." Pt endorses depressive feelings including isolating, insomnia, tearfulness, lack of appetite, and feeling hopeless. During the assessment the pt appeared irritable based on his demeanor and his responses to the assessors questions. Pt had a blanket covering his face for much of the interview and was at times inaudible  during the assessment.  I have reviewed the chart and agree with assessment above with updates as follows:   Today pt is calm, cooperative, alert and oriented x 4 and denying suicidal/homicidal ideations and auditory/visual hallucinations. Pt does not appear to be responding to internal stimuli. Pt reported that he used crack cocaine yesterday 5-7 times and this is what he does to relieve his stress. Pt reports he takes care of his elderly mother and aunt as well as his children and children's mother. Pt reported that he has no help from anyone, that a sister is supposed to be helping with the care-giving but she doesn't. Pt stated that he had been sober since his last stay at [facility] in August but started using again yesterday. Pt reported he has attempted suicide in the past at age 64 by trying to slit his wrists but today is denying any suicidal ideations. Pt was discharged form [facility] 11/2015 on citalopram serequel, and lamictal but is not taking any of his medications at this time. Pt has outpatient services in place at [facility] and this is who is managing his medications. Patient has a high degree of secondary gain by coming inpatient due to the stressors at home and his continued depression.   Past Psychiatric History: BiPolar, MDD, Substance abuse Suicidal Ideation: No Risk to Self: No Risk to Others: Homicidal Ideation: No Thoughts of Harm to Others: No Current Homicidal Intent: No Current Homicidal Plan: No Access to Homicidal Means: No History of harm to others?: No Assessment of Violence: None Noted  Does patient have access to weapons?: No Criminal Charges Pending?: No Does patient have a court date: No Prior Inpatient Therapy: Prior Inpatient Therapy: Yes Prior Therapy Dates: multiple  Prior Therapy Facilty/Provider(s): [facility], [facility], [facility]  Reason for Treatment: SI, SA Prior Outpatient Therapy: Prior Outpatient Therapy: Yes Prior Therapy Dates: ongoing Prior Therapy Facilty/Provider(s):  [facility] Reason for Treatment: Depression Does patient have an ACCT team?: No Does patient have Intensive In-House Services?  : No Does patient have [facility] services? : No Does patient have P4CC services?: No  Past Medical History:  Past Medical History:  Diagnosis Date  . Anxiety disorder CHEST PAIN  . Arthritis   . Basal cell carcinoma of anal skin   . Bipolar disorder ([place])    [facility]  . Depression   . 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 Hyperlipidemia   . Hypertension    denies  . Internal hemorrhoid   . Kidney stones   . Migraine   . Nasal sinus congestion   . Panic attacks   . Rectum pain   . Schizophrenia [facility])     Past Surgical History:  Procedure Laterality Date  . APPENDECTOMY  AGE 37  . COLONOSCOPY  2006   Dr. Rowe Pavy: normal colonoscopy.  . COLONOSCOPY N/A 03/06/2013   Dr. Gala Romney- friable hemorrhoids;o/w negative colonoscopy  . ESOPHAGOGASTRODUODENOSCOPY (EGD) WITH ESOPHAGEAL DILATION N/A 03/06/2013   Dr. Gala Romney- erosive/ulcerative reflux esophagitis. reactive gastopathy with mild chronic inflamation on bx.  Marland Kitchen HEMORRHOID SURGERY  03/21/2012   Procedure: HEMORRHOIDECTOMY;  Surgeon: Leighton Ruff, MD;  Location: [facility];  Service: General;  Laterality: N/A;  . KNEE SURGERY    . LEFT KNEE SURGERY  AGE 42  . MOUTH SURGERY    . RECTAL BIOPSY  03/21/2012   Procedure: BIOPSY RECTAL;  Surgeon: Leighton Ruff, MD;  Location: [facility];  Service: General;  Laterality: N/A;  WIDE LOCAL EXCISON OF ANAL MASS, POSSIBLE HEMORRHOIDECTOMY  . TEETH EXTRACTIONS AND EXCISION MOUTH LESION  AGE 77  . TRANSANAL EXCISION OF RECTAL MASS  04/07/2012   Procedure: TRANSANAL EXCISION OF RECTAL MASS;  Surgeon: Leighton Ruff, MD;  Location: [facility];  Service: General;  Laterality: N/A;  Wide Local Excision of Perianal Mass   Family History:  Family History  Problem Relation Age of  Onset  . Cancer Father     Leukemia  . Colon cancer Father     82s  . Alcoholism Father   . Alcoholism Brother    Family Psychiatric  History: Alcoholism and BiPolar Social History:  History  Alcohol Use  . Yes    Comment: occ     History  Drug Use  . Types: Cocaine, Marijuana    Comment: Denies     Social History   Social History  . Marital status: Divorced    Spouse name: N/A  . Number of children: N/A  . Years of education: N/A   Social History Main Topics  . Smoking status: Former Smoker    Types: Cigarettes  . Smokeless tobacco: Current User    Types: Snuff, Chew     Comment: DIP AND CHEW TOBACCO FOR 40 YRS/ SMOKING UNTIL AGE 16 APPROX. >5 YRS  (PER PT DENIES SMOKING AT THIS TIME)  . Alcohol use Yes     Comment: occ  . Drug use:     Types: Cocaine, Marijuana     Comment:  Denies   . Sexual activity: Not Asked   Other Topics Concern  . None   Social History Narrative  . None   Additional Social History:     Allergies:   Allergies  Allergen Reactions  . Rocephin [Ceftriaxone Sodium In Dextrose] Anaphylaxis    THROAT AND TONGUE  . Flomax [Tamsulosin Hcl] Rash    Labs:  Results for orders placed or performed during the hospital encounter of 01/25/16 (from the past 48 hour(s))  Ethanol     Status: Abnormal   Collection Time: 01/25/16 11:45 PM  Result Value Ref Range   Alcohol, Ethyl (B) 7 (H) <5 mg/dL    Comment:        LOWEST DETECTABLE LIMIT FOR SERUM ALCOHOL IS 5 mg/dL FOR MEDICAL PURPOSES ONLY   Salicylate level     Status: None   Collection Time: 01/25/16 11:45 PM  Result Value Ref Range   Salicylate Lvl <7.3 2.8 - 30.0 mg/dL  Acetaminophen level     Status: Abnormal   Collection Time: 01/25/16 11:45 PM  Result Value Ref Range   Acetaminophen (Tylenol), Serum <10 (L) 10 - 30 ug/mL    Comment:        THERAPEUTIC CONCENTRATIONS VARY SIGNIFICANTLY. A RANGE OF 10-30 ug/mL MAY BE AN EFFECTIVE CONCENTRATION FOR MANY PATIENTS. HOWEVER,  SOME ARE BEST TREATED AT CONCENTRATIONS OUTSIDE THIS RANGE. ACETAMINOPHEN CONCENTRATIONS >150 ug/mL AT 4 HOURS AFTER INGESTION AND >50 ug/mL AT 12 HOURS AFTER INGESTION ARE OFTEN ASSOCIATED WITH TOXIC REACTIONS.   Comprehensive metabolic panel     Status: Abnormal   Collection Time: 01/25/16 11:51 PM  Result Value Ref Range   Sodium 138 135 - 145 mmol/L   Potassium 3.5 3.5 - 5.1 mmol/L   Chloride 106 101 - 111 mmol/L   CO2 25 22 - 32 mmol/L   Glucose, Bld 120 (H) 65 - 99 mg/dL   BUN 9 6 - 20 mg/dL   Creatinine, Ser 0.79 0.61 - 1.24 mg/dL   Calcium 9.3 8.9 - 10.3 mg/dL   Total Protein 7.2 6.5 - 8.1 g/dL   Albumin 4.4 3.5 - 5.0 g/dL   AST 16 15 - 41 U/L   ALT 23 17 - 63 U/L   Alkaline Phosphatase 46 38 - 126 U/L   Total Bilirubin 1.0 0.3 - 1.2 mg/dL   GFR calc non Af Amer >60 >60 mL/min   GFR calc Af Amer >60 >60 mL/min    Comment: (NOTE) The eGFR has been calculated using the CKD EPI equation. This calculation has not been validated in all clinical situations. eGFR's persistently <60 mL/min signify possible Chronic Kidney Disease.    Anion gap 7 5 - 15  CBC with Diff     Status: None   Collection Time: 01/25/16 11:51 PM  Result Value Ref Range   WBC 8.5 4.0 - 10.5 K/uL   RBC 4.97 4.22 - 5.81 MIL/uL   Hemoglobin 14.6 13.0 - 17.0 g/dL   HCT 42.3 39.0 - 52.0 %   MCV 85.1 78.0 - 100.0 fL   MCH 29.4 26.0 - 34.0 pg   MCHC 34.5 30.0 - 36.0 g/dL   RDW 12.9 11.5 - 15.5 %   Platelets 236 150 - 400 K/uL   Neutrophils Relative % 63 %   Neutro Abs 5.3 1.7 - 7.7 K/uL   Lymphocytes Relative 28 %   Lymphs Abs 2.4 0.7 - 4.0 K/uL   Monocytes Relative 8 %   Monocytes Absolute 0.7 0.1 -  1.0 K/uL   Eosinophils Relative 1 %   Eosinophils Absolute 0.1 0.0 - 0.7 K/uL   Basophils Relative 0 %   Basophils Absolute 0.0 0.0 - 0.1 K/uL  Urine rapid drug screen (hosp performed)     Status: Abnormal   Collection Time: 01/26/16  5:14 AM  Result Value Ref Range   Opiates NONE DETECTED NONE  DETECTED   Cocaine POSITIVE (A) NONE DETECTED   Benzodiazepines POSITIVE (A) NONE DETECTED   Amphetamines NONE DETECTED NONE DETECTED   Tetrahydrocannabinol NONE DETECTED NONE DETECTED   Barbiturates NONE DETECTED NONE DETECTED    Comment:        DRUG SCREEN FOR MEDICAL PURPOSES ONLY.  IF CONFIRMATION IS NEEDED FOR ANY PURPOSE, NOTIFY LAB WITHIN 5 DAYS.        LOWEST DETECTABLE LIMITS FOR URINE DRUG SCREEN Drug Class       Cutoff (ng/mL) Amphetamine      1000 Barbiturate      200 Benzodiazepine   643 Tricyclics       329 Opiates          300 Cocaine          300 THC              50     Current Facility-Administered Medications  Medication Dose Route Frequency Provider Last Rate Last Dose  . acetaminophen (TYLENOL) tablet 650 mg  650 mg Oral Q4H PRN Rolland Porter, MD   650 mg at 01/26/16 5188  . alum & mag hydroxide-simeth (MAALOX/MYLANTA) 200-200-20 MG/5ML suspension 30 mL  30 mL Oral PRN Rolland Porter, MD      . chlorproMAZINE (THORAZINE) tablet 50 mg  50 mg Oral QHS Rolland Porter, MD      . citalopram (CELEXA) tablet 10 mg  10 mg Oral Daily Rolland Porter, MD      . hydrOXYzine (ATARAX/VISTARIL) tablet 25 mg  25 mg Oral TID PRN Rolland Porter, MD      . ibuprofen (ADVIL,MOTRIN) tablet 600 mg  600 mg Oral Q8H PRN Rolland Porter, MD      . nicotine (NICODERM CQ - dosed in mg/24 hours) patch 21 mg  21 mg Transdermal Daily Rolland Porter, MD      . ondansetron (ZOFRAN) tablet 4 mg  4 mg Oral Q8H PRN Rolland Porter, MD      . traZODone (DESYREL) tablet 600 mg  600 mg Oral QHS Rolland Porter, MD       Current Outpatient Prescriptions  Medication Sig Dispense Refill  . citalopram (CELEXA) 10 MG tablet Take 1 tablet (10 mg total) by mouth daily. For depression 30 tablet 0  . hydrOXYzine (ATARAX/VISTARIL) 25 MG tablet Take 1 tablet (25 mg total) by mouth 3 (three) times daily as needed for anxiety. 60 tablet 0  . ibuprofen (ADVIL,MOTRIN) 800 MG tablet Take 1 tablet (800 mg total) by mouth every 8 (eight) hours as needed for  headache or moderate pain. 30 tablet 0  . methocarbamol (ROBAXIN) 500 MG tablet Take 1 tablet (500 mg total) by mouth every 8 (eight) hours as needed for muscle spasms (moderate pain). 15 tablet 0  . nicotine (NICODERM CQ - DOSED IN MG/24 HOURS) 21 mg/24hr patch Place 1 patch (21 mg total) onto the skin daily. For smoking cessation 28 patch 0  . QUEtiapine (SEROQUEL) 25 MG tablet Take 1 tablet (25 mg total) by mouth every 8 (eight) hours as needed (anxiety, irritability). 60 tablet 0  . QUEtiapine (SEROQUEL) 300  MG tablet Take 1 tablet (300 mg total) by mouth at bedtime. For mood control 30 tablet 0    Musculoskeletal: Unable to assess, camera  Psychiatric Specialty Exam: Physical Exam  ROS  Blood pressure 170/93, pulse 84, temperature 98.4 F (36.9 C), temperature source Oral, resp. rate 16, height 5' 8"  (1.727 m), weight 101.2 kg (223 lb), SpO2 97 %.Body mass index is 33.91 kg/m.  General Appearance: Casual and Fairly Groomed  Eye Contact:  Good  Speech:  Clear and Coherent and Normal Rate  Volume:  Decreased  Mood:  Depressed  Affect:  Congruent and Depressed  Thought Process:  Coherent  Orientation:  Full (Time, Place, and Person)  Thought Content:  Logical  Suicidal Thoughts:  No  Homicidal Thoughts:  No  Memory:  Immediate;   Good Recent;   Good Remote;   Fair  Judgement:  Fair  Insight:  Fair  Psychomotor Activity:  Normal  Concentration:  Concentration: Good and Attention Span: Good  Recall:  Good  Fund of Knowledge:  Good  Language:  Good  Akathisia:  No  Handed:  Right  AIMS (if indicated):     Assets:  Agricultural consultant Housing Resilience Social Support Transportation  ADL's:  Intact  Cognition:  WNL  Sleep:        Treatment Plan Summary: Discharge Home Keep all follow up appointments. Continue to utilize [facility] services for substance abuse help and counseling and to get restarted on his antipsychotic medications.   Do not use alcohol while taking antipsychotic medications and pain medications.   Discussed case with Dr Parke Poisson who agrees that at this time there are no grounds to keep patient in the hospital and patient may discharge home.   Disposition: No evidence of imminent risk to self or others at present.   Patient does not meet criteria for psychiatric inpatient admission. Discussed crisis plan, support from social network, calling 911, coming to the Emergency Department, and calling Suicide Hotline.    Ethelene Hal, NP 01/26/2016 9:27 AM

## 2016-01-26 NOTE — ED Notes (Signed)
TTS in progress 

## 2016-01-26 NOTE — BH Assessment (Signed)
Tele Assessment Note   Larry Hamilton is an 50 y.o. male who presents to the ED voluntarily. Pt reports he has been extremely stressed having to care for his elderly mother and aunt and his sister does not help him. Pt reports he has been feeling suicidal and states he has been feeling this way for years. Pt was asked if he has a plan to commit suicide and pt stated "there are a million ways to die, you can take pills, hang yourself, drive off a cliff, drink yourself to death, everyone comes to a boiling point and I'm getting close to that boiling point. Pt reports to seeing shadows and figures and states that he "hears conversations and music" in his mind. Pt reports he has been to [facility] in [facility] for suicidal ideations in the past and states 2 or 3 of his family members committed suicide.    Pt endorses using cocaine yesterday and states he "doesn't use it that often." Pt endorses depressive feelings including isolating, insomnia, tearfulness, lack of appetite, and feeling hopeless. During the assessment the pt appeared irritable based on his demeanor and his responses to the assessors questions. Pt had a blanket covering his face for much of the interview and was at times inaudible during the assessment.   Per Janalyn Rouse, FNP pt will need an AM psych eval. Quinn Axe, RN has been notified.   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 Rowe Pavy: normal colonoscopy.   COLONOSCOPY N/A 03/06/2013   Dr. Gala Romney- friable hemorrhoids;o/w negative colonoscopy   ESOPHAGOGASTRODUODENOSCOPY (EGD) WITH ESOPHAGEAL DILATION N/A 03/06/2013   Dr. Gala Romney- erosive/ulcerative reflux esophagitis. reactive gastopathy with mild chronic inflamation on bx.   HEMORRHOID SURGERY  03/21/2012   Procedure: HEMORRHOIDECTOMY;  Surgeon: Leighton Ruff, MD;  Location: [facility];  Service: General;  Laterality: N/A;   KNEE SURGERY     LEFT KNEE SURGERY  AGE 26   MOUTH SURGERY     RECTAL BIOPSY  03/21/2012   Procedure: BIOPSY RECTAL;  Surgeon: Leighton Ruff, MD;  Location: [place];  Service: General;  Laterality: N/A;  WIDE LOCAL EXCISON OF ANAL MASS, POSSIBLE HEMORRHOIDECTOMY   TEETH EXTRACTIONS AND EXCISION MOUTH LESION  AGE 21   TRANSANAL EXCISION OF RECTAL MASS  04/07/2012   Procedure: TRANSANAL EXCISION OF RECTAL MASS;  Surgeon: Leighton Ruff, MD;  Location: [facility];  Service: General;  Laterality: N/A;  Wide Local Excision of Perianal Mass    Family History:  Family History  Problem Relation Age of Onset   Cancer Father     Leukemia   Colon cancer Father     92s   Alcoholism Father  Alcoholism Brother     Social History:  reports that he has quit smoking. His smoking use included Cigarettes. His smokeless tobacco use includes Snuff and Chew. He reports that he drinks alcohol. He reports that he uses drugs, including Cocaine and Marijuana.  Additional Social History:  Alcohol / Drug Use Pain Medications: Pt denies abuse Prescriptions: Pt denies abuse Over the Counter: Pt denies abuse History of alcohol / drug use?: Yes Substance #1 Name of Substance 1: Cocaine  1 - Age of First Use: 19 1 - Amount (size/oz): 1 gram 1 - Frequency: unsure 1 - Duration: pt reports he tried it at age 44 and did not start using it again  until 6 years ago 1 - Last Use / Amount: yesterday  CIWA: CIWA-Ar BP: 170/93 Pulse Rate: 84 COWS:    PATIENT STRENGTHS: (choose at least two) Communication skills General fund of knowledge Motivation for treatment/growth  Allergies:  Allergies  Allergen Reactions   Rocephin [Ceftriaxone Sodium In Dextrose] Anaphylaxis    THROAT AND TONGUE   Flomax [Tamsulosin Hcl] Rash    Home Medications:  (Not in a hospital admission)  OB/GYN Status:  No LMP for male patient.  General Assessment Data Location of Assessment: [facility] TTS Assessment: In system Is this a Tele or Face-to-Face Assessment?: Tele Assessment Is this an Initial Assessment or a Re-assessment for this encounter?: Initial Assessment Marital status: Divorced Is patient pregnant?: No Pregnancy Status: No Living Arrangements: Spouse/significant other, Children Can pt return to current living arrangement?: Yes Admission Status: Voluntary Is patient capable of signing voluntary admission?: Yes Referral Source: Self/Family/Friend Insurance type: Medicaid     Crisis Care Plan Living Arrangements: Spouse/significant other, Children Name of Psychiatrist: [facility] Name of Therapist: None  Education Status Is patient currently in school?: No Highest grade of school patient has completed: pt reports he "was 4 months away from getting his GED"   Risk to self with the past 6 months Suicidal Ideation: Yes-Currently Present Has patient been a risk to self within the past 6 months prior to admission? : Yes Suicidal Intent: Yes-Currently Present Has patient had any suicidal intent within the past 6 months prior to admission? : Yes Is patient at risk for suicide?: Yes Suicidal Plan?: No Has patient had any suicidal plan within the past 6 months prior to admission? : No Access to Means: No What has been your use of drugs/alcohol within the last 12 months?: reports to cocaine usage Previous Attempts/Gestures: Yes How  many times?:  (pt reports "multiple") Triggers for Past Attempts: Other (Comment), Family contact (stress) Intentional Self Injurious Behavior: None Family Suicide History: Yes Recent stressful life event(s): Other (Comment) (pt reports issues with sister and family) Persecutory voices/beliefs?: No Depression: Yes Depression Symptoms: Despondent, Tearfulness, Isolating, Loss of interest in usual pleasures, Feeling worthless/self pity, Feeling angry/irritable Substance abuse history and/or treatment for substance abuse?: Yes Suicide prevention information given to non-admitted patients: Not applicable  Risk to Others within the past 6 months Homicidal Ideation: No Does patient have any lifetime risk of violence toward others beyond the six months prior to admission? : No Thoughts of Harm to Others: No Current Homicidal Intent: No Current Homicidal Plan: No Access to Homicidal Means: No History of harm to others?: No Assessment of Violence: None Noted Does patient have access to weapons?: No Criminal Charges Pending?: No Does patient have a court date: No Is patient on probation?: No  Psychosis Hallucinations: Auditory, Visual Delusions: None noted  Mental Status Report  Appearance/Hygiene: In scrubs, Disheveled Eye Contact: Poor Motor Activity: Freedom of movement, Restlessness Speech: Logical/coherent, Aggressive Level of Consciousness: Alert, Irritable Mood: Depressed, Angry, Irritable Affect: Angry Anxiety Level: None Thought Processes: Coherent, Relevant Judgement: Impaired Orientation: Person, Place, Time, Situation, Appropriate for developmental age Obsessive Compulsive Thoughts/Behaviors: None  Cognitive Functioning Concentration: Normal Memory: Recent Intact, Remote Intact IQ: Average Insight: Fair Impulse Control: Fair Appetite: Poor (pt reports he is eating 1x/day) Sleep: Decreased Total Hours of Sleep: 4 Vegetative Symptoms: Not bathing, Decreased  grooming, Staying in bed  ADLScreening [facility] Assessment Services) Patient's cognitive ability adequate to safely complete daily activities?: Yes Patient able to express need for assistance with ADLs?: Yes Independently performs ADLs?: Yes (appropriate for developmental age)  Prior Inpatient Therapy Prior Inpatient Therapy: Yes Prior Therapy Dates: multiple  Prior Therapy Facilty/Provider(s): [facility], [facility]  Reason for Treatment: SI, SA  Prior Outpatient Therapy Prior Outpatient Therapy: Yes Prior Therapy Dates: ongoing Prior Therapy Facilty/Provider(s): [facility] Reason for Treatment: Depression Does patient have an ACCT team?: No Does patient have Intensive In-House Services?  : No Does patient have [facility] services? : No Does patient have P4CC services?: No  ADL Screening (condition at time of admission) Patient's cognitive ability adequate to safely complete daily activities?: Yes Is the patient deaf or have difficulty hearing?: No Does the patient have difficulty seeing, even when wearing glasses/contacts?: No Does the patient have difficulty concentrating, remembering, or making decisions?: No Patient able to express need for assistance with ADLs?: Yes Does the patient have difficulty dressing or bathing?: No Independently performs ADLs?: Yes (appropriate for developmental age) Does the patient have difficulty walking or climbing stairs?: No Weakness of Legs: Both (pt reports he has pain in his knees) Weakness of Arms/Hands: None  Home Assistive Devices/Equipment Home Assistive Devices/Equipment: None    Abuse/Neglect Assessment (Assessment to be complete while patient is alone) Physical Abuse: Denies Verbal Abuse: Denies Sexual Abuse: Denies Exploitation of patient/patient's resources: Denies Self-Neglect: Denies     Regulatory affairs officer (For Healthcare) Does patient have an advance directive?: No Would patient like information on creating an advanced directive?:  No - patient declined information    Additional Information 1:1 In Past 12 Months?: No CIRT Risk: No Elopement Risk: No Does patient have medical clearance?: Yes     Disposition:  Disposition Initial Assessment Completed for this Encounter: Yes Disposition of Patient: Other dispositions Other disposition(s): Other (Comment) (AM psych eval per Lindon Romp, FNP )  Lyanne Co 01/26/2016 6:45 AM

## 2016-01-26 NOTE — Discharge Instructions (Signed)
Follow up with out pt tx for substance abuse
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
## 2016-01-26 NOTE — ED Notes (Addendum)
Pt request tylenol

## 2016-01-26 NOTE — ED Notes (Signed)
Call from [facility] - they will reassess this am after extender arrives

## 2016-01-26 NOTE — ED Notes (Signed)
PT given information on outpatient substance abuse services. PT verbalized understanding for follow up with outpatient and denied any SI/HI at this time.

## 2016-02-27 ENCOUNTER — Ambulatory Visit: Payer: Self-pay | Admitting: Pediatrics

## 2016-02-28 ENCOUNTER — Encounter: Payer: Self-pay | Admitting: Pediatrics

## 2016-03-18 ENCOUNTER — Encounter: Payer: Self-pay | Admitting: Pediatrics

## 2016-03-18 ENCOUNTER — Encounter (INDEPENDENT_AMBULATORY_CARE_PROVIDER_SITE_OTHER): Payer: Self-pay

## 2016-03-18 ENCOUNTER — Ambulatory Visit (INDEPENDENT_AMBULATORY_CARE_PROVIDER_SITE_OTHER): Payer: Medicaid Other | Admitting: Pediatrics

## 2016-03-18 VITALS — BP 131/86 | HR 84 | Temp 97.8°F | Ht 68.0 in | Wt 227.0 lb

## 2016-03-18 DIAGNOSIS — G43709 Chronic migraine without aura, not intractable, without status migrainosus: Secondary | ICD-10-CM

## 2016-03-18 DIAGNOSIS — IMO0002 Reserved for concepts with insufficient information to code with codable children: Secondary | ICD-10-CM

## 2016-03-18 DIAGNOSIS — F329 Major depressive disorder, single episode, unspecified: Secondary | ICD-10-CM

## 2016-03-18 DIAGNOSIS — Z23 Encounter for immunization: Secondary | ICD-10-CM

## 2016-03-18 DIAGNOSIS — F319 Bipolar disorder, unspecified: Secondary | ICD-10-CM | POA: Diagnosis not present

## 2016-03-18 DIAGNOSIS — R079 Chest pain, unspecified: Secondary | ICD-10-CM

## 2016-03-18 DIAGNOSIS — F32A Depression, unspecified: Secondary | ICD-10-CM

## 2016-03-18 DIAGNOSIS — Z800 Family history of malignant neoplasm of digestive organs: Secondary | ICD-10-CM

## 2016-03-18 DIAGNOSIS — R739 Hyperglycemia, unspecified: Secondary | ICD-10-CM

## 2016-03-18 DIAGNOSIS — Z6834 Body mass index (BMI) 34.0-34.9, adult: Secondary | ICD-10-CM | POA: Diagnosis not present

## 2016-03-18 LAB — BAYER DCA HB A1C WAIVED: HB A1C (BAYER DCA - WAIVED): 5.3 % (ref <7.0)

## 2016-03-18 MED ORDER — QUETIAPINE FUMARATE 300 MG PO TABS: 300.0000 mg | ORAL_TABLET | Freq: Every day | ORAL | 1 refills | Status: DC
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
## 2016-03-18 NOTE — Patient Instructions (Addendum)
I put in a referral for psychiatry for someone to help with medication management. I included this list below as a resource to find a counselor, someone you can talk to regularly in addition to seeing the psychiatrist for medication management. Also we can likely get you into counseling sooner than the psychiatrist.  Your provider wants you to schedule an appointment with a Psychologist/Psychiatrist. The following list of offices requires the patient to call and make their own appointment, as there is information they need that only you can provide. Please feel free to choose form the following providers:  [facility] in [place]  [place]  [phone] [place], [place]  (Scheduled through [place]) Must call and do an interview for appointment. Sees Children / Accepts Medicaid  [facility] [place]  [address] [place], [place]  [phone] [place], [place] for Autism but does not treat it Sees Children / Accepts Medicaid  [facility]    [phone] [address], [address]   [place], [place] Medication management, substance abuse, bipolar, grief, family, marriage, OCD, anxiety, PTSD Sees children / Accepts Medicaid  [facility]    [phone] [id number] [place], [place] Sees children / Accepts Medicaid  Dr Lorenza Evangelist     [phone] [address], [place], [place]  Sees ADD & ADHD for treatment Accepts [facility]  [facility]  [phone] [address] [place], [place] for Autism Accepts Medicaid  [place]   [phone] [address], [place] children as young as 27 years old Accepts Medicaid

## 2016-03-19 LAB — CMP14+EGFR
ALBUMIN: 4.4 g/dL (ref 3.5–5.5)
ALT: 25 IU/L (ref 0–44)
AST: 15 IU/L (ref 0–40)
Albumin/Globulin Ratio: 1.9 (ref 1.2–2.2)
Alkaline Phosphatase: 63 IU/L (ref 39–117)
BUN/Creatinine Ratio: 15 (ref 9–20)
BUN: 14 mg/dL (ref 6–24)
Bilirubin Total: 0.3 mg/dL (ref 0.0–1.2)
CALCIUM: 9.7 mg/dL (ref 8.7–10.2)
CO2: 23 mmol/L (ref 18–29)
CREATININE: 0.95 mg/dL (ref 0.76–1.27)
Chloride: 105 mmol/L (ref 96–106)
GFR, EST AFRICAN AMERICAN: 108 mL/min/{1.73_m2} (ref >59)
GFR, EST NON AFRICAN AMERICAN: 94 mL/min/{1.73_m2} (ref >59)
GLOBULIN, TOTAL: 2.3 g/dL (ref 1.5–4.5)
Glucose: 113 mg/dL — ABNORMAL HIGH (ref 65–99)
Potassium: 4.4 mmol/L (ref 3.5–5.2)
SODIUM: 143 mmol/L (ref 134–144)
TOTAL PROTEIN: 6.7 g/dL (ref 6.0–8.5)

## 2016-03-19 LAB — LIPID PANEL
CHOL/HDL RATIO: 4.8 ratio (ref 0.0–5.0)
Cholesterol, Total: 209 mg/dL — ABNORMAL HIGH (ref 100–199)
HDL: 44 mg/dL (ref >39)
LDL CALC: 111 mg/dL — AB (ref 0–99)
TRIGLYCERIDES: 272 mg/dL — AB (ref 0–149)
VLDL Cholesterol Cal: 54 mg/dL — ABNORMAL HIGH (ref 5–40)

## 2016-05-07 ENCOUNTER — Encounter (HOSPITAL_COMMUNITY): Payer: Self-pay | Admitting: Emergency Medicine

## 2016-05-07 ENCOUNTER — Emergency Department (HOSPITAL_COMMUNITY)
Admission: EM | Admit: 2016-05-07 | Discharge: 2016-05-07 | Disposition: A | Discharge status: Home / Self Care | Payer: Medicaid Other | Attending: Emergency Medicine | Admitting: Emergency Medicine

## 2016-05-07 ENCOUNTER — Emergency Department (HOSPITAL_COMMUNITY): Payer: Medicaid Other

## 2016-05-07 DIAGNOSIS — F17220 Nicotine dependence, chewing tobacco, uncomplicated: Secondary | ICD-10-CM | POA: Diagnosis not present

## 2016-05-07 DIAGNOSIS — Z79899 Other long term (current) drug therapy: Secondary | ICD-10-CM | POA: Insufficient documentation

## 2016-05-07 DIAGNOSIS — F17290 Nicotine dependence, other tobacco product, uncomplicated: Secondary | ICD-10-CM | POA: Diagnosis not present

## 2016-05-07 DIAGNOSIS — R0789 Other chest pain: Secondary | ICD-10-CM | POA: Insufficient documentation

## 2016-05-07 DIAGNOSIS — R079 Chest pain, unspecified: Secondary | ICD-10-CM

## 2016-05-07 DIAGNOSIS — R1013 Epigastric pain: Secondary | ICD-10-CM | POA: Diagnosis not present

## 2016-05-07 LAB — I-STAT TROPONIN, ED: Troponin i, poc: 0 ng/mL (ref 0.00–0.08)

## 2016-05-07 LAB — BASIC METABOLIC PANEL
ANION GAP: 8 (ref 5–15)
BUN: 15 mg/dL (ref 6–20)
CALCIUM: 9.4 mg/dL (ref 8.9–10.3)
CO2: 25 mmol/L (ref 22–32)
Chloride: 106 mmol/L (ref 101–111)
Creatinine, Ser: 0.95 mg/dL (ref 0.61–1.24)
GFR calc Af Amer: >60 mL/min (ref >60)
GLUCOSE: 103 mg/dL — AB (ref 65–99)
Potassium: 4 mmol/L (ref 3.5–5.1)
SODIUM: 139 mmol/L (ref 135–145)

## 2016-05-07 LAB — CBC
HCT: 44.6 % (ref 39.0–52.0)
HEMOGLOBIN: 15.7 g/dL (ref 13.0–17.0)
MCH: 29.9 pg (ref 26.0–34.0)
MCHC: 35.2 g/dL (ref 30.0–36.0)
MCV: 85 fL (ref 78.0–100.0)
Platelets: 239 10*3/uL (ref 150–400)
RBC: 5.25 MIL/uL (ref 4.22–5.81)
RDW: 12.8 % (ref 11.5–15.5)
WBC: 7.1 10*3/uL (ref 4.0–10.5)

## 2016-05-07 LAB — TROPONIN I: Troponin I: <0.03 ng/mL (ref <0.03)

## 2016-05-07 MED ORDER — SUCRALFATE 1 GM/10ML PO SUSP: 1.0000 g | Freq: Three times a day (TID) | ORAL | 0 refills | Status: DC

## 2016-05-07 MED ORDER — OMEPRAZOLE 20 MG PO CPDR: 20.0000 mg | DELAYED_RELEASE_CAPSULE | Freq: Every day | ORAL | 1 refills | Status: DC

## 2016-05-07 MED ORDER — GI COCKTAIL ~~LOC~~
30.0000 mL | Freq: Once | ORAL | Status: AC
  Administered 2016-05-07: 30 mL via ORAL
  Filled 2016-05-07: qty 30

## 2016-05-07 MED ORDER — ACETAMINOPHEN 500 MG PO TABS
1000.0000 mg | ORAL_TABLET | Freq: Once | ORAL | Status: AC
  Administered 2016-05-07: 1000 mg via ORAL
  Filled 2016-05-07: qty 2

## 2016-05-07 NOTE — ED Notes (Signed)
Pt states chest pain is dull at present 2/10, headache worse

## 2016-05-07 NOTE — ED Notes (Signed)
ekg given to Dr. Cook  

## 2016-05-07 NOTE — Discharge Instructions (Signed)

## 2016-05-07 NOTE — ED Triage Notes (Signed)
Pt reports pain in center of chest radiating to bilateral arms x3-4 days.  Pt also has h/a and sore throat, bodyaches. Pt alert and oriented.

## 2016-05-07 NOTE — ED Notes (Signed)
Patient given discharge instruction, verbalized understand. Patient ambulatory out of the department.  

## 2016-05-07 NOTE — ED Provider Notes (Signed)
Emergency Department Provider Note   I have reviewed the triage vital signs and the nursing notes.   HISTORY  Chief Complaint Chest Pain   HPI Larry Hamilton is a 51 y.o. male with PMH of bipolar disorder, HLD, and HTN presents to the emergency department for evaluation of intermittent chest and epigastric discomfort over the past 3-4 days. Feels that symptoms are worsening. He describes them as worse in the afternoon and evening. Also notes that are worse with food. Patient describes a burning sensation in his chest. At home is been drinking milk and taking Rolaids which give him some temporary relief and then the discomfort returns. He reports a history of upper endoscopy in 2014 that did not have any significant findings. Denies any associated coughing, fever, vomiting, diarrhea. No history of prior heart attack or stroke. No pain currently. He does note some associated bilateral arm discomfort at this time that does not appear related to the chest pain.   Past Medical History:  Diagnosis Date  . Anxiety disorder CHEST PAIN  . Arthritis   . Basal cell carcinoma of anal skin   . Bipolar disorder ([place])    [facility]  . Depression   . 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 Hyperlipidemia   . Hypertension    denies  . Internal hemorrhoid   . Kidney stones   . Migraine   . Nasal sinus congestion   . Panic attacks   . Rectum pain   . Schizophrenia [facility])     Patient Active Problem List   Diagnosis Date Noted  . Cocaine abuse   . Depression   . Suicidal ideation   . Cocaine use disorder, moderate, dependence ([place]) 11/30/2015  . Bipolar 1 disorder ([place]) 11/29/2015  . Dysphagia, unspecified(787.20) 02/15/2013  . Family history of colon cancer 02/15/2013  . Primary basal cell carcinoma of anus 03/18/2012    Past Surgical History:  Procedure Laterality Date  . APPENDECTOMY  AGE 78  . COLONOSCOPY   2006   Dr. Rowe Pavy: normal colonoscopy.  . COLONOSCOPY N/A 03/06/2013   Dr. Gala Romney- friable hemorrhoids;o/w negative colonoscopy  . ESOPHAGOGASTRODUODENOSCOPY (EGD) WITH ESOPHAGEAL DILATION N/A 03/06/2013   Dr. Gala Romney- erosive/ulcerative reflux esophagitis. reactive gastopathy with mild chronic inflamation on bx.  Marland Kitchen HEMORRHOID SURGERY  03/21/2012   Procedure: HEMORRHOIDECTOMY;  Surgeon: Leighton Ruff, MD;  [facility];  Service: General;  Laterality: N/A;  . KNEE SURGERY    . LEFT KNEE SURGERY  AGE 51  . MOUTH SURGERY    . RECTAL BIOPSY  03/21/2012   Procedure: BIOPSY RECTAL;  Surgeon: Leighton Ruff, MD;  Location: [facility];  Service: General;  Laterality: N/A;  WIDE LOCAL EXCISON OF ANAL MASS, POSSIBLE HEMORRHOIDECTOMY  . TEETH EXTRACTIONS AND EXCISION MOUTH LESION  AGE 45  . TRANSANAL EXCISION OF RECTAL MASS  04/07/2012   Procedure: TRANSANAL EXCISION OF RECTAL MASS;  Surgeon: Leighton Ruff, MD;  Location: [facility];  Service: General;  Laterality: N/A;  Wide Local Excision of Perianal Mass    Current Outpatient Rx  . Order #: [id number] Class: Historical Med  . Order #: [id number] Class: Normal  . Order #: [id number] Class: Historical Med  . Order #: [id number] Class: Print  . Order #: [id number] Class: Print    Allergies Rocephin [ceftriaxone sodium in dextrose] and Flomax [tamsulosin hcl]  Family History  Problem Relation Age of Onset  .  Cancer Father     Leukemia  . Colon cancer Father     17s  . Alcoholism Father   . Alcoholism Brother     Social History Social History  Substance Use Topics  . Smoking status: Former Smoker    Types: Cigarettes  . Smokeless tobacco: Current User    Types: Snuff, Chew     Comment: DIP AND CHEW TOBACCO FOR 40 YRS/ SMOKING UNTIL AGE 72 APPROX. >5 YRS  (PER PT DENIES SMOKING AT THIS TIME)  . Alcohol use Yes     Comment: occ    Review of Systems  Constitutional: No fever/chills Eyes: No visual  changes. ENT: No sore throat. Cardiovascular: Positive chest pain. Respiratory: Denies shortness of breath. Gastrointestinal: Positive epigastric abdominal pain.  No nausea, no vomiting.  No diarrhea.  No constipation. Genitourinary: Negative for dysuria. Musculoskeletal: Negative for back pain. Skin: Negative for rash. Neurological: Negative for headaches, focal weakness or numbness.  10-point ROS otherwise negative.  ____________________________________________   PHYSICAL EXAM:  VITAL SIGNS: ED Triage Vitals [05/07/16 1347]  Enc Vitals Group     BP 135/99     Pulse Rate 82     Resp 18     Temp 97.9 F (36.6 C)     Temp Source Oral     SpO2 97 %     Weight 227 lb (103 kg)     Height 5\' 8"  (1.727 m)     Pain Score 8   Constitutional: Alert and oriented. Well appearing and in no acute distress. Eyes: Conjunctivae are normal.  Head: Atraumatic. Nose: No congestion/rhinnorhea. Mouth/Throat: Mucous membranes are moist.  Neck: No stridor.   Cardiovascular: Normal rate, regular rhythm. Good peripheral circulation. Grossly normal heart sounds.   Respiratory: Normal respiratory effort.  No retractions. Lungs CTAB. Gastrointestinal: Soft and nontender. No distention.  Musculoskeletal: No lower extremity tenderness nor edema. No gross deformities of extremities. Neurologic:  Normal speech and language. No gross focal neurologic deficits are appreciated.  Skin:  Skin is warm, dry and intact. No rash noted.  ____________________________________________   LABS (all labs ordered are listed, but only abnormal results are displayed)  Labs Reviewed  BASIC METABOLIC PANEL - Abnormal; Notable for the following:       Result Value   Glucose, Bld 103 (*)    All other components within normal limits  CBC  TROPONIN I  I-STAT TROPOININ, ED   ____________________________________________  EKG   EKG Interpretation  Date/Time:  Thursday May 07 2016 16:58:33 EST Ventricular  Rate:  73 PR Interval:    QRS Duration: 88 QT Interval:  401 QTC Calculation: 442 R Axis:   72 Text Interpretation:  Sinus rhythm No STEMI. Similar to prior.  Confirmed by Sarika Baldini MD, Trinetta Alemu [phone]) on 05/07/2016 5:03:04 PM       ____________________________________________  RADIOLOGY  Dg Chest 2 View  Result Date: 05/07/2016 CLINICAL DATA:  Midline chest pain for 5 days EXAM: CHEST  2 VIEW COMPARISON:  Chest x-ray of 04/03/2014 FINDINGS: No active infiltrate or effusion is seen. Mediastinal and hilar contours are unremarkable. The heart is within normal limits in size. No bony abnormality is seen. IMPRESSION: No active cardiopulmonary disease. Electronically Signed   By: Ivar Drape M.D.   On: 05/07/2016 14:17    ____________________________________________   PROCEDURES  Procedure(s) performed:   Procedures  None ____________________________________________   INITIAL IMPRESSION / ASSESSMENT AND PLAN / ED COURSE  Pertinent labs & imaging results that were available  during my care of the patient were reviewed by me and considered in my medical decision making (see chart for details).  Patient resents to the emergency department for evaluation of chest and epigastric pain. Pain is burning in quality and worse with food. He has some mild epigastric tenderness. No fevers or chills. Symptoms sound most consistent with GERD or PUD. Also considering ACS. No CP currently. Initial troponin negative. Patient with HEART score of 3 (age and risk factors). Plan for serial biomarkers and GERD treatment with outpatient stress testing if second troponin is negative.   Repeat troponin is negative. No active chest pain. Low ACS suspicion. Believe that the patient is appropriate for additional outpatient risk stratification. Will call PCP in the AM or return with worsening symptoms.   At this time, I do not feel there is any life-threatening condition present. I have reviewed and discussed all  results (EKG, imaging, lab, urine as appropriate), exam findings with patient. I have reviewed nursing notes and appropriate previous records.  I feel the patient is safe to be discharged home without further emergent workup. Discussed usual and customary return precautions. Patient and family (if present) verbalize understanding and are comfortable with this plan.  Patient will follow-up with their primary care provider. If they do not have a primary care provider, information for follow-up has been provided to them. All questions have been answered.  ____________________________________________  FINAL CLINICAL IMPRESSION(S) / ED DIAGNOSES  Final diagnoses:  Nonspecific chest pain     MEDICATIONS GIVEN DURING THIS VISIT:  Medications  gi cocktail (Maalox,Lidocaine,Donnatal) (30 mLs Oral Given 05/07/16 1709)  acetaminophen (TYLENOL) tablet 1,000 mg (1,000 mg Oral Given 05/07/16 1708)     NEW OUTPATIENT MEDICATIONS STARTED DURING THIS VISIT:  Discharge Medication List as of 05/07/2016  6:37 PM    START taking these medications   Details  omeprazole (PRILOSEC) 20 MG capsule Take 1 capsule (20 mg total) by mouth daily., Starting Thu 05/07/2016, Print    sucralfate (CARAFATE) 1 GM/10ML suspension Take 10 mLs (1 g total) by mouth 4 (four) times daily -  with meals and at bedtime., Starting Thu 05/07/2016, Print          Note:  This document was prepared using Dragon voice recognition software and may include unintentional dictation errors.  Nanda Quinton, MD Emergency Medicine   Margette Fast, MD 05/07/16 2102

## 2016-05-08 ENCOUNTER — Encounter: Payer: Self-pay | Admitting: Pediatrics

## 2016-05-08 ENCOUNTER — Ambulatory Visit (INDEPENDENT_AMBULATORY_CARE_PROVIDER_SITE_OTHER): Payer: Medicaid Other | Admitting: Pediatrics

## 2016-05-08 VITALS — BP 135/85 | HR 81 | Temp 98.7°F | Resp 22 | Ht 68.0 in | Wt 227.0 lb

## 2016-05-08 DIAGNOSIS — F319 Bipolar disorder, unspecified: Secondary | ICD-10-CM

## 2016-05-08 DIAGNOSIS — R079 Chest pain, unspecified: Secondary | ICD-10-CM | POA: Diagnosis not present

## 2016-05-08 DIAGNOSIS — J069 Acute upper respiratory infection, unspecified: Secondary | ICD-10-CM

## 2016-05-08 MED ORDER — QUETIAPINE FUMARATE 300 MG PO TABS: 300.0000 mg | ORAL_TABLET | Freq: Every day | ORAL | 1 refills | Status: DC

## 2016-05-08 NOTE — Patient Instructions (Signed)
Netipot with distilled water 2-3 times a day to clear out sinuses Or Normal saline nasal spray Flonase steroid nasal spray Antihistamine daily such as cetirizine tylenol Lots of fluids

## 2016-05-08 NOTE — Progress Notes (Signed)
  Subjective:   Patient ID: Larry Hamilton, male    DOB: [birth year], 51 y.o.   MRN: [id number] CC: Shortness of Breath; Chills; and Nasal Congestion  HPI: Larry Hamilton is a 51 y.o. 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 Allergies and medications reviewed and updated. History  Smoking Status  . Former Smoker  . Types: Cigarettes  Smokeless Tobacco  . Current User  . Types: Snuff, Chew    Comment: DIP AND CHEW TOBACCO FOR 40 YRS/ SMOKING UNTIL AGE 52 APPROX. >5 YRS  (PER PT DENIES SMOKING AT THIS TIME)   ROS: Per HPI   Objective:    BP 135/85   Pulse 81   Temp 98.7 F (37.1 C) (Oral)   Resp (!) 22   Ht 5\' 8"  (1.727 m)   Wt 227 lb (103 kg)   SpO2 98%   BMI 34.52 kg/m   Wt Readings from Last 3 Encounters:  05/08/16 227 lb (103 kg)  05/07/16 227 lb (103 kg)  03/18/16 227 lb (103 kg)    Gen: NAD, alert, cooperative with exam, NCAT, congested EYES: EOMI, no conjunctival injection, or no icterus ENT:  TMs dull gray visible portions b/l, OP without  erythema, no TTP over sinuses LYMPH: no cervical LAD CV: NRRR, normal S1/S2, no murmur, distal pulses 2+ b/l Resp: CTABL, no wheezes, normal WOB Abd: +BS, soft, NTND. no guarding or organomegaly Ext: No edema, warm Neuro: Alert and oriented MSK: normal muscle bulk  Assessment & Plan:  Larry Hamilton was seen today for shortness of breath, chills and nasal congestion.  Diagnoses and all orders for this visit:  Acute URI Discussed symptomatic care No fevers  Chest pain, unspecified type Exertional chest pain -     Ambulatory referral to Cardiology  Bipolar 1 disorder ([place]) Stable symptoms per pt, cont below -     QUEtiapine (SEROQUEL) 300 MG tablet; Take 1 tablet (300 mg total) by mouth at bedtime. For mood control   Follow up plan: Return in about 4 weeks (around 06/05/2016) for CPE. Assunta Found, MD [place]

## 2016-05-11 ENCOUNTER — Telehealth: Payer: Self-pay | Admitting: Pediatrics

## 2016-05-11 NOTE — Telephone Encounter (Signed)
Prescription sent to pharmacy on 05/08/2016

## 2016-05-13 ENCOUNTER — Telehealth: Payer: Self-pay | Admitting: Pediatrics

## 2016-05-13 NOTE — Telephone Encounter (Signed)
Pharmacy has been changed to [facility] and [facility] has been removed from patients pharmacy list

## 2016-05-15 ENCOUNTER — Ambulatory Visit: Payer: Self-pay | Admitting: Cardiovascular Disease

## 2016-05-21 ENCOUNTER — Other Ambulatory Visit (HOSPITAL_BASED_OUTPATIENT_CLINIC_OR_DEPARTMENT_OTHER): Payer: Self-pay

## 2016-05-21 DIAGNOSIS — G4733 Obstructive sleep apnea (adult) (pediatric): Secondary | ICD-10-CM

## 2016-06-05 ENCOUNTER — Encounter: Payer: Medicaid Other | Admitting: Pediatrics

## 2016-06-15 ENCOUNTER — Ambulatory Visit: Payer: Self-pay | Admitting: Cardiovascular Disease

## 2016-06-17 ENCOUNTER — Encounter: Payer: Medicaid Other | Admitting: Pediatrics

## 2016-06-25 ENCOUNTER — Encounter: Payer: Self-pay | Admitting: Pediatrics

## 2016-07-03 ENCOUNTER — Other Ambulatory Visit: Payer: Self-pay | Admitting: Pediatrics

## 2016-07-03 DIAGNOSIS — F319 Bipolar disorder, unspecified: Secondary | ICD-10-CM

## 2016-07-22 ENCOUNTER — Encounter: Payer: Self-pay | Admitting: Family Medicine

## 2016-07-22 ENCOUNTER — Ambulatory Visit (INDEPENDENT_AMBULATORY_CARE_PROVIDER_SITE_OTHER): Payer: Medicaid Other | Admitting: Family Medicine

## 2016-07-22 VITALS — HR 95 | Ht 68.0 in | Wt 234.0 lb

## 2016-07-22 DIAGNOSIS — Z024 Encounter for examination for driving license: Secondary | ICD-10-CM

## 2016-07-22 NOTE — Progress Notes (Signed)
Subjective:  Patient ID: Larry Hamilton, male    DOB: 08-28-1965  Age: 51 y.o. MRN: [id number]  CC: Private DOT Physical   HPI SPYRIDON HORNSTEIN Hamilton presents for DOT evaluation.   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   He has a past surgical history that includes Knee surgery; Mouth surgery; LEFT KNEE SURGERY (AGE 89); TEETH EXTRACTIONS AND EXCISION MOUTH LESION (AGE 29); Appendectomy (AGE 82); Rectal biopsy (03/21/2012); Hemorrhoid surgery (03/21/2012); Transanal excision of rectal mass (04/07/2012); Colonoscopy (2006); Colonoscopy (N/A, 03/06/2013); and Esophagogastroduodenoscopy (egd) with esophageal dilation (N/A, 03/06/2013).   His family history includes Alcoholism in his brother and father; Cancer in his father; Colon cancer in his father.He reports that he has quit smoking. His smoking use included Cigarettes. His smokeless tobacco use includes Snuff and Chew. He reports that he drinks alcohol. He reports that he uses drugs, including Cocaine, Marijuana, and Benzodiazepines.    ROS Review of Systems  Constitutional: Negative for activity change, appetite change, chills, diaphoresis, fatigue, fever and unexpected weight change.  HENT: Negative for congestion, ear pain, hearing loss, postnasal drip, rhinorrhea, sore throat, tinnitus and trouble swallowing.   Eyes: Negative for photophobia, pain, discharge and redness.  Respiratory: Negative for apnea, cough, choking, chest tightness, shortness of breath, wheezing and stridor.   Cardiovascular: Negative for chest pain, palpitations and leg swelling.    Gastrointestinal: Negative for abdominal distention, abdominal pain, blood in stool, constipation, diarrhea, nausea and vomiting.  Endocrine: Negative for cold intolerance, heat intolerance, polydipsia, polyphagia and polyuria.  Genitourinary: Negative for difficulty urinating, dysuria, enuresis, flank pain, frequency, genital sores, hematuria and urgency.  Musculoskeletal: Negative for arthralgias and joint swelling.  Skin: Negative for color change, rash and wound.  Allergic/Immunologic: Negative for immunocompromised state.  Neurological: Negative for dizziness, tremors, seizures, syncope, facial asymmetry, speech difficulty, weakness, light-headedness, numbness and headaches.  Hematological: Does not bruise/bleed easily.  Psychiatric/Behavioral: Negative for agitation, behavioral problems, confusion, decreased concentration, dysphoric mood, hallucinations, sleep disturbance and suicidal ideas. The patient is not nervous/anxious and is not hyperactive.     Objective:  Pulse 95   Ht 5\' 8"  (1.727 m)   Wt 234 lb (106.1 kg)   BMI 35.58 kg/m   BP Readings from Last 3 Encounters:  05/08/16 135/85  05/07/16 131/83  03/18/16 131/86    Wt Readings from Last 3 Encounters:  07/22/16 234 lb (106.1 kg)  05/08/16 227 lb (103 kg)  05/07/16 227 lb (103 kg)     Physical Exam  Constitutional: He is oriented to person, place, and time. He appears well-developed and well-nourished. No distress.  HENT:  Head: Normocephalic and atraumatic.  Right Ear: External ear normal.  Left Ear: External ear normal.  Nose: Nose normal.  Mouth/Throat: Oropharynx is clear and moist.  Eyes: Conjunctivae and EOM are normal. Pupils are equal, round, and reactive to light.  Neck: Normal range of motion. Neck supple. No tracheal deviation present. No thyromegaly present.  Cardiovascular: Normal rate, regular rhythm and normal heart sounds.  Exam reveals no gallop and no friction rub.   No murmur  heard. Pulmonary/Chest: Effort normal and breath sounds normal. No respiratory distress. He has no wheezes. He  has no rales.  Abdominal: Soft. Bowel sounds are normal. He exhibits no distension and no mass. There is no tenderness.  Musculoskeletal: Normal range of motion. He exhibits no edema.  Lymphadenopathy:    He has no cervical adenopathy.  Neurological: He is alert and oriented to person, place, and time. He has normal reflexes.  Skin: Skin is warm and dry. No rash noted.  Psychiatric: He has a normal mood and affect. His behavior is normal. Judgment and thought content normal.      Assessment & Plan:   Abb was seen today for private dot physical.  Diagnoses and all orders for this visit:  Encounter for Department of Transportation (DOT) examination for driving license renewal       I have discontinued Mr. Kuehne's traMADol and sucralfate. I am also having him maintain his Multiple Vitamins-Minerals (ONE-A-DAY MENS 50+ ADVANTAGE PO), omeprazole, and QUEtiapine.  Allergies as of 07/22/2016      Reactions   Rocephin [ceftriaxone Sodium In Dextrose] Anaphylaxis   THROAT AND TONGUE   Flomax [tamsulosin Hcl] Rash      Medication List       Accurate as of 07/22/16  9:08 AM. Always use your most recent med list.          omeprazole 20 MG capsule Commonly known as:  PRILOSEC Take 1 capsule (20 mg total) by mouth daily.   ONE-A-DAY MENS 50+ ADVANTAGE PO Take 1 tablet by mouth daily.   QUEtiapine 300 MG tablet Commonly known as:  SEROQUEL take 1 tablet by mouth at bedtime for MOOD CONTROL        Follow-up: Return in about 1 year (around 07/22/2017).  Claretta Fraise, M.D.

## 2016-09-29 ENCOUNTER — Telehealth: Payer: Self-pay | Admitting: Pediatrics

## 2016-09-29 DIAGNOSIS — F319 Bipolar disorder, unspecified: Secondary | ICD-10-CM

## 2016-09-29 MED ORDER — QUETIAPINE FUMARATE 300 MG PO TABS: ORAL_TABLET | ORAL | 5 refills | Status: DC

## 2016-09-29 NOTE — Telephone Encounter (Signed)
Okay to refill all meds for 6 mos 

## 2016-09-29 NOTE — Telephone Encounter (Signed)
Seroquel sent to pharmacy per Dr. Livia Snellen

## 2016-09-29 NOTE — Telephone Encounter (Signed)
Patient called requesting refill on seroquel.  Last written 07/06/16 with 1 refill last seen 07/22/16

## 2016-10-01 ENCOUNTER — Other Ambulatory Visit: Payer: Self-pay | Admitting: *Deleted

## 2016-11-30 ENCOUNTER — Ambulatory Visit: Payer: Medicaid Other | Admitting: Pediatrics

## 2016-12-01 ENCOUNTER — Encounter: Payer: Self-pay | Admitting: Pediatrics

## 2016-12-01 ENCOUNTER — Telehealth: Payer: Self-pay | Admitting: Pediatrics

## 2017-03-08 ENCOUNTER — Other Ambulatory Visit: Payer: Self-pay | Admitting: Family Medicine

## 2017-03-08 DIAGNOSIS — F319 Bipolar disorder, unspecified: Secondary | ICD-10-CM

## 2017-03-10 ENCOUNTER — Other Ambulatory Visit: Payer: Self-pay | Admitting: Family Medicine

## 2017-03-10 DIAGNOSIS — F319 Bipolar disorder, unspecified: Secondary | ICD-10-CM

## 2017-03-11 NOTE — Telephone Encounter (Signed)
Refill called to [facility] 

## 2017-04-07 ENCOUNTER — Other Ambulatory Visit: Payer: Self-pay | Admitting: Family Medicine

## 2017-04-07 DIAGNOSIS — F319 Bipolar disorder, unspecified: Secondary | ICD-10-CM

## 2017-04-07 NOTE — Telephone Encounter (Signed)
Last seen 07/22/16  Dr Evette Doffing   If approved route to nurse to call into [facility]

## 2017-04-14 ENCOUNTER — Other Ambulatory Visit: Payer: Self-pay | Admitting: Family Medicine

## 2017-04-14 DIAGNOSIS — F319 Bipolar disorder, unspecified: Secondary | ICD-10-CM

## 2017-05-28 ENCOUNTER — Other Ambulatory Visit: Payer: Self-pay | Admitting: Pediatrics

## 2017-05-28 DIAGNOSIS — F319 Bipolar disorder, unspecified: Secondary | ICD-10-CM

## 2017-05-28 NOTE — Telephone Encounter (Signed)
Last seen 07/22/16  Dr Livia Snellen

## 2017-05-28 NOTE — Telephone Encounter (Signed)
Authorize 30 days only. Then contact the patient letting them know that they will need an appointment before any further prescriptions can be sent in. 

## 2017-06-21 ENCOUNTER — Other Ambulatory Visit: Payer: Self-pay | Admitting: Family Medicine

## 2017-06-21 DIAGNOSIS — F319 Bipolar disorder, unspecified: Secondary | ICD-10-CM

## 2017-06-22 ENCOUNTER — Other Ambulatory Visit: Payer: Self-pay | Admitting: Family Medicine

## 2017-06-22 DIAGNOSIS — F319 Bipolar disorder, unspecified: Secondary | ICD-10-CM

## 2017-07-16 ENCOUNTER — Ambulatory Visit: Payer: Self-pay | Admitting: Pediatrics

## 2017-07-16 ENCOUNTER — Telehealth: Payer: Self-pay | Admitting: Pediatrics

## 2017-07-16 DIAGNOSIS — F319 Bipolar disorder, unspecified: Secondary | ICD-10-CM

## 2017-07-16 MED ORDER — QUETIAPINE FUMARATE 300 MG PO TABS: ORAL_TABLET | ORAL | 0 refills | Status: DC

## 2017-07-16 NOTE — Telephone Encounter (Signed)
Please refill for 1 month

## 2017-07-16 NOTE — Telephone Encounter (Signed)
Medication sent to pharmacy, patient aware.  Scheduled follow up appointment with Dr. Evette Doffing on 08/06/17 at 2:00 pm.

## 2017-08-05 ENCOUNTER — Encounter: Payer: Self-pay | Admitting: Internal Medicine

## 2017-08-06 ENCOUNTER — Telehealth: Payer: Self-pay

## 2017-08-06 ENCOUNTER — Ambulatory Visit (INDEPENDENT_AMBULATORY_CARE_PROVIDER_SITE_OTHER): Payer: BLUE CROSS/BLUE SHIELD | Admitting: Pediatrics

## 2017-08-06 ENCOUNTER — Encounter: Payer: Self-pay | Admitting: Pediatrics

## 2017-08-06 VITALS — BP 132/88 | HR 92 | Temp 97.9°F | Ht 68.0 in | Wt 234.0 lb

## 2017-08-06 DIAGNOSIS — K219 Gastro-esophageal reflux disease without esophagitis: Secondary | ICD-10-CM

## 2017-08-06 DIAGNOSIS — Z136 Encounter for screening for cardiovascular disorders: Secondary | ICD-10-CM

## 2017-08-06 DIAGNOSIS — F319 Bipolar disorder, unspecified: Secondary | ICD-10-CM

## 2017-08-06 MED ORDER — QUETIAPINE FUMARATE 300 MG PO TABS: ORAL_TABLET | ORAL | 1 refills | Status: DC

## 2017-08-06 NOTE — Telephone Encounter (Signed)
[facility] left a voice mail message   

## 2017-08-06 NOTE — Progress Notes (Signed)
  Subjective:   Patient ID: Larry Hamilton, male    DOB: Apr 23, 1965, 52 y.o.   MRN: [id number] CC: Follow-up multiple med problems  HPI: [name] is a 52 y.o. male presenting for Follow-up  Here for follow-up bipolar.  Last seen 15 months ago.  Now working as a Administrator.  Says in general his mood has been okay.  He has periods where he gets very angry, not a danger to himself or others, but has punched through a wall.  Taking Seroquel once a day at night.  No longer using cocaine.  Reflux: Taking omeprazole.  No further chest pain since reflux was treated per patient.  Church, wife, and son have all been good supports to him.  Relevant past medical, surgical, family and social history reviewed. Allergies and medications reviewed and updated. Social History   Tobacco Use  Smoking Status Former Smoker  . Types: Cigarettes  Smokeless Tobacco Current User  . Types: Snuff, Chew  Tobacco Comment   DIP AND CHEW TOBACCO FOR 40 YRS/ SMOKING UNTIL AGE 89 APPROX. >5 YRS  (PER PT DENIES SMOKING AT THIS TIME)   ROS: Per HPI   Objective:    BP 132/88   Pulse 92   Temp 97.9 F (36.6 C) (Oral)   Ht '5\' 8"'$  (1.727 m)   Wt 234 lb (106.1 kg)   BMI 35.58 kg/m   Wt Readings from Last 3 Encounters:  08/06/17 234 lb (106.1 kg)  07/22/16 234 lb (106.1 kg)  05/08/16 227 lb (103 kg)    Gen: NAD, alert, cooperative with exam, NCAT EYES: EOMI, no conjunctival injection, or no icterus ENT:  TMs pearly gray b/l, OP without erythema LYMPH: no cervical LAD CV: NRRR, normal S1/S2, no murmur, distal pulses 2+ b/l Resp: CTABL, no wheezes, normal WOB Abd: +BS, soft, NTND.  Ext: No edema, warm Neuro: Alert and oriented MSK: normal muscle bulk Psych: Normal affect.  Sometimes with slightly pressured speech.  Assessment & Plan:  Eustace was seen today for follow-up med problems.  Diagnoses and all orders for this visit:  Bipolar 1 disorder ([place]) Ongoing symptoms.  PHQ9 15 today.   Feels safe at home.  No thoughts of self-harm, or harm to others.  Will continue Seroquel.  Refer to psychiatry. -     QUEtiapine (SEROQUEL) 300 MG tablet; TAKE 1 TABLET AT BEDTIME FOR MOOD CONTROL -     BMP8+EGFR -     CBC with Differential/Platelet -     Ambulatory referral to Psychiatry  Screening for ischemic heart disease -     Lipid panel  Gastroesophageal reflux disease, esophagitis presence not specified Stable, continue PPI  Follow up plan: Return in about 1 month (around 09/05/2017). Assunta Found, MD [place]

## 2017-08-06 NOTE — Telephone Encounter (Signed)
Larry Hamilton left a voice mail message   

## 2017-08-17 NOTE — Addendum Note (Signed)
Addended by: Georgianne Fick on: 08/17/2017 12:08 PM   Modules accepted: Orders

## 2017-08-17 NOTE — Telephone Encounter (Signed)
I typically just have to get a co-sign when I do a referral so I had entered a new referral for psychiatry since he will be linked to an external provider.

## 2017-08-17 NOTE — Telephone Encounter (Addendum)
Patient reports that he will not go to [facility] because he has had a bad experience with Dr. Hoyle Barr in the past.  Patient was willing to try [facility] for medication management and therapy.  Patient was open to referral and given phone number to contact, said he would call today.

## 2017-08-23 ENCOUNTER — Telehealth: Payer: Self-pay | Admitting: Clinical

## 2017-08-23 NOTE — Telephone Encounter (Signed)
Pt's brother answered and he reported patient is working out of town and didn't have another number to call him.  This [facility] specialist left name & contact information for patient to call back, both [facility] general number and this [facility]'s number [phone].  [facility] specialist following up about referral to [facility] for mental health services.   TC to [place] at [phone] and spoke with [facility] who reported they have not received a referral.

## 2017-08-23 NOTE — Telephone Encounter (Signed)
TC from [name] and this [facility] specialist introduced herself to him.  [name] reported he lost the number for [facility] and has not contacted them.  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  This [facility] offered to call [facility] and give his cell number to them.  [name] reported he will call [place] directly today.  [facility] specialist gave me him the telephone number to [place].  Plan: Mr. Supple to call [facility] for an appointment.

## 2017-08-24 ENCOUNTER — Telehealth: Payer: Self-pay | Admitting: Clinical

## 2017-08-24 NOTE — Telephone Encounter (Signed)
2:50pm - 3:00pm (10 min)  TC to Mr. Munyon to see if he has been able to set up an appointment with [facility], he reported he did set up an appointment but forgot what time it was. And that he will need to bring additional information to them. However, he is going on a trip starting tonight so he does not know when he will be able to go to [employer].  Mr. Michalowski was informed to call [facility] team if he needs additional assistance in coordinating his care.  Plan: [place] team to follow up in 2-4 weeks to see if he's connected with services.

## 2017-08-25 NOTE — Telephone Encounter (Signed)
Thanks

## 2017-09-03 ENCOUNTER — Ambulatory Visit: Payer: BLUE CROSS/BLUE SHIELD | Admitting: Pediatrics

## 2017-09-08 ENCOUNTER — Encounter: Payer: Self-pay | Admitting: Pediatrics

## 2017-10-08 ENCOUNTER — Telehealth: Payer: Self-pay

## 2017-10-08 NOTE — Telephone Encounter (Signed)
[facility] - Left Msg 

## 2017-11-08 ENCOUNTER — Telehealth: Payer: Self-pay

## 2017-11-08 NOTE — Telephone Encounter (Signed)
Writer left message in order to follow up to ensure that the patient went ot his appt with [place] [facility].

## 2017-11-29 ENCOUNTER — Encounter: Payer: Self-pay | Admitting: *Deleted

## 2017-11-29 ENCOUNTER — Encounter: Payer: Self-pay | Admitting: Nurse Practitioner

## 2017-11-29 ENCOUNTER — Ambulatory Visit (INDEPENDENT_AMBULATORY_CARE_PROVIDER_SITE_OTHER): Payer: Self-pay | Admitting: Nurse Practitioner

## 2017-11-29 ENCOUNTER — Telehealth: Payer: Self-pay | Admitting: *Deleted

## 2017-11-29 VITALS — BP 130/92 | HR 90 | Temp 98.0°F | Ht 68.0 in | Wt 232.2 lb

## 2017-11-29 DIAGNOSIS — Z800 Family history of malignant neoplasm of digestive organs: Secondary | ICD-10-CM

## 2017-11-29 DIAGNOSIS — K649 Unspecified hemorrhoids: Secondary | ICD-10-CM

## 2017-11-29 DIAGNOSIS — K921 Melena: Secondary | ICD-10-CM

## 2017-11-29 DIAGNOSIS — Z0000 Encounter for general adult medical examination without abnormal findings: Secondary | ICD-10-CM

## 2017-11-29 HISTORY — DX: Melena: K92.1

## 2017-11-29 HISTORY — DX: Unspecified hemorrhoids: K64.9

## 2017-11-29 HISTORY — DX: Encounter for general adult medical examination without abnormal findings: Z00.00

## 2017-11-29 MED ORDER — HYDROCORTISONE 2.5 % RE CREA: 1.0000 "application " | TOPICAL_CREAM | Freq: Two times a day (BID) | RECTAL | 1 refills | Status: DC

## 2017-11-29 MED ORDER — PEG 3350-KCL-NA BICARB-NACL 420 G PO SOLR: 4000.0000 mL | Freq: Once | ORAL | 0 refills | Status: AC

## 2017-11-29 NOTE — Assessment & Plan Note (Signed)
Patient noted hematochezia in the setting of known hemorrhoids.  Does have a family history of colon cancer in his father who contracted cancer at age 51.  He is due for his 5-year repeat colonoscopy.  We will schedule this at this time.  Hematochezia likely due to known hemorrhoids.  He is interested in hemorrhoid banding if he is a candidate.  I will send in Anusol, follow-up based on post procedure recommendations.

## 2017-11-29 NOTE — Progress Notes (Signed)
Primary Care Physician:  Eustaquio Maize, MD Primary Gastroenterologist:  Dr. Gala Romney  Chief Complaint  Patient presents with  . Consult    TCS/EGD  . Hemorrhoids    had bleeding recently x 9 days    HPI:   Larry Hamilton is a 52 y.o. male who presents to schedule high risk surveillance colonoscopy.  Last colonoscopy completed 03/06/2013 which found friable hemorrhoids, otherwise normal colonoscopy.  Recurrent hematochezia with known hemorrhoids that is failed topical therapy and would likely benefit from hemorrhoid banding.  Recommended repeat colonoscopy in 5 years.  Recommended in office hemorrhoid banding.  The patient did undergo hemorrhoid banding on 03/28/2013 in office.  The patient was a no-show to follow-up office visit 05/05/2013 and has not been in our office since.  Today he states he's doing ok overall. Has a history of hemorrhoids. 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 Has tried Preparation H which has helped some. No Anusol to use. He is interested in hemorrhoid banding if a candidate. Had basal cell carcinoma removed in 2013. Father had colon CA in his 42's. Has some left-sided chronic abdominal pain which he "have had forever." Denies N/V, melena, fever, chills, unintentional weight loss. Denies chest pain, dyspnea, dizziness, lightheadedness, syncope, near syncope. Denies any other upper or lower GI symptoms.  Past Medical History:  Diagnosis Date  . Anxiety disorder CHEST PAIN  . Arthritis   . Basal cell carcinoma of anal skin   . Bipolar disorder ([place])    [facility]  . Depression   . 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 Hyperlipidemia   . Hypertension    denies  . Internal hemorrhoid   . Kidney stones   . Migraine   . Nasal sinus congestion   . Panic attacks   . Rectum pain   . Schizophrenia [facility])     Past Surgical  History:  Procedure Laterality Date  . APPENDECTOMY  AGE 32  . COLONOSCOPY  2006   Dr. Rowe Pavy: normal colonoscopy.  . COLONOSCOPY N/A 03/06/2013   Dr. Gala Romney- friable hemorrhoids;o/w negative colonoscopy  . ESOPHAGOGASTRODUODENOSCOPY (EGD) WITH ESOPHAGEAL DILATION N/A 03/06/2013   Dr. Gala Romney- erosive/ulcerative reflux esophagitis. reactive gastopathy with mild chronic inflamation on bx.  Marland Kitchen HEMORRHOID SURGERY  03/21/2012   Procedure: HEMORRHOIDECTOMY;  Surgeon: Leighton Ruff, MD;  Location: [facility];  Service: General;  Laterality: N/A;  . KNEE SURGERY    . LEFT KNEE SURGERY  AGE 54  . MOUTH SURGERY    . RECTAL BIOPSY  03/21/2012   Procedure: BIOPSY RECTAL;  Surgeon: Leighton Ruff, MD;  [facility];  Service: General;  Laterality: N/A;  WIDE LOCAL EXCISON OF ANAL MASS, POSSIBLE HEMORRHOIDECTOMY  . TEETH EXTRACTIONS AND EXCISION MOUTH LESION  AGE 19  . TRANSANAL EXCISION OF RECTAL MASS  04/07/2012   Procedure: TRANSANAL EXCISION OF RECTAL MASS;  Surgeon: Leighton Ruff, MD;  Location: [facility];  Service: General;  Laterality: N/A;  Wide Local Excision of Perianal Mass    Current Outpatient Medications  Medication Sig Dispense Refill  . ALPRAZolam (XANAX) 1 MG tablet Take 1 mg by mouth as needed.     Marland Kitchen QUEtiapine (SEROQUEL) 300 MG tablet TAKE 1 TABLET AT BEDTIME FOR MOOD CONTROL 90 tablet 1   No current facility-administered medications for this  visit.     Allergies as of 11/29/2017 - Review Complete 11/29/2017  Allergen Reaction Noted  . Rocephin [ceftriaxone sodium in dextrose] Anaphylaxis 12/20/2011  . Flomax [tamsulosin hcl] Rash 01/30/2015    Family History  Problem Relation Age of Onset  . Cancer Father        Leukemia  . Colon cancer Father        69s  . Alcoholism Father   . Alcoholism Brother     Social History   Socioeconomic History  . Marital status: Divorced    Spouse name: Not on file  . Number of children: Not on file    . Years of education: Not on file  . Highest education level: Not on file  Occupational History  . Not on file  Social Needs  . Financial resource strain: Not on file  . Food insecurity:    Worry: Not on file    Inability: Not on file  . Transportation needs:    Medical: Not on file    Non-medical: Not on file  Tobacco Use  . Smoking status: Former Smoker    Types: Cigarettes  . Smokeless tobacco: Current User    Types: Snuff, Chew  . Tobacco comment: DIP AND CHEW TOBACCO FOR 40 YRS/ SMOKING UNTIL AGE 25 APPROX. >5 YRS  (PER PT DENIES SMOKING AT THIS TIME)  Substance and Sexual Activity  . Alcohol use: Yes    Comment: Rare: 12 pack of beer in a year  . Drug use: Yes    Types: Cocaine, Marijuana    Comment: Last cocaine 1 year ago, no marijuana in 2-3 years.  . Sexual activity: Not on file  Lifestyle  . Physical activity:    Days per week: Not on file    Minutes per session: Not on file  . Stress: Not on file  Relationships  . Social connections:    Talks on phone: Not on file    Gets together: Not on file    Attends religious service: Not on file    Active member of club or organization: Not on file    Attends meetings of clubs or organizations: Not on file    Relationship status: Not on file  . Intimate partner violence:    Fear of current or ex partner: Not on file    Emotionally abused: Not on file    Physically abused: Not on file    Forced sexual activity: Not on file  Other Topics Concern  . Not on file  Social History Narrative  . Not on file    Review of Systems: General: Negative for anorexia, weight loss, fever, chills, fatigue, weakness. ENT: Negative for hoarseness, difficulty swallowing. CV: Negative for chest pain, angina, palpitations, peripheral edema.  Respiratory: Negative for dyspnea at rest, cough, sputum, wheezing.  GI: See history of present illness. MS: Negative for joint pain, low back pain.  Derm: Negative for rash or itching.  Endo:  Negative for unusual weight change.  Heme: Negative for bruising or bleeding. Allergy: Negative for rash or hives.    Physical Exam: BP (!) 130/92   Pulse 90   Temp 98 F (36.7 C) (Oral)   Ht 5\' 8"  (1.727 m)   Wt 232 lb 3.2 oz (105.3 kg)   BMI 35.31 kg/m  General:   Alert and oriented. Pleasant and cooperative. Well-nourished and well-developed.  Head:  Normocephalic and atraumatic. Eyes:  Without icterus, sclera clear and conjunctiva pink.  Ears:  Normal auditory  acuity. Cardiovascular:  S1, S2 present without murmurs appreciated. Extremities without clubbing or edema. Respiratory:  Clear to auscultation bilaterally. No wheezes, rales, or rhonchi. No distress.  Gastrointestinal:  +BS, soft, non-tender and non-distended. No HSM noted. No guarding or rebound. No masses appreciated.  Rectal:  Deferred  Musculoskalatal:  Symmetrical without gross deformities. Skin:  Intact without significant lesions or rashes. Neurologic:  Alert and oriented x4;  grossly normal neurologically. Psych:  Alert and cooperative. Normal mood and affect. Heme/Lymph/Immune: No excessive bruising noted.    11/29/2017 4:01 PM   Disclaimer: This note was dictated with voice recognition software. Similar sounding words can inadvertently be transcribed and may not be corrected upon review.

## 2017-11-29 NOTE — Patient Instructions (Signed)
1. Have your blood test drawn when you are able to. 2. We will refer you to ENT related to your concerns about mouth and throat cancer. 3. I have sent in Anusol to your pharmacy.  You can take this up to twice a day for up to 10 days at a time.  At that point take it 3 to 5-day break. 4. We will schedule your colonoscopy for you.   5. Further recommendations to be made after your colonoscopy. 6. Return for follow-up based on recommendations made after your colonoscopy.  At [facility] we value your feedback. You may receive a survey about your visit today. Please share your experience as we strive to create trusting relationships with our patients to provide genuine, compassionate, quality care.  It was great to meet you today!  I hope you have a great summer!!

## 2017-11-29 NOTE — Assessment & Plan Note (Signed)
Patient with a known history of hemorrhoids.  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  He has had some recent hematochezia.  He previously underwent one episode of hemorrhoid banding but was a no-show for second episode.  Colonoscopy is currently due.  He is interested in hemorrhoid banding again if he is a candidate.  We will schedule his colonoscopy as per above.  Further recommendations to follow after colonoscopy related to hemorrhoid banding.  I will send in Anusol of his pharmacy at this time.  Follow-up based on post procedure recommendations.

## 2017-11-29 NOTE — Telephone Encounter (Signed)
Patient left prior to scheduling his TCS. He is scheduled for 01/06/18 at 12:45pm. Aware instructions have been mailed and will call regarding pre-op appt.

## 2017-11-29 NOTE — Assessment & Plan Note (Signed)
The patient is interested in being evaluated for mouth and throat cancer because of his history of dipping.  EGD is not likely to provide this.  We will refer him to ENT.  Additionally he had surgery prior to 1980s and was told he should pursue a one-time hepatitis C screening.  We can provide this today.

## 2017-11-29 NOTE — Assessment & Plan Note (Signed)
Family history of colon cancer in his father who had cancer in his 65s.  He is due for his repeat 5-year high risk surveillance.  Return for follow-up based on post procedure recommendations.  Proceed with TCS on propofol/MAC with Dr. Gala Romney in near future: the risks, benefits, and alternatives have been discussed with the patient in detail. The patient states understanding and desires to proceed.  The patient is currently on Xanax, Seroquel.  He does have a history of cocaine use but he states he has not had any in 1 year.  At this point we will plan for procedure on propofol/MAC to promote adequate sedation.  We will recommend a urine drug screen prior to his procedure.

## 2017-11-30 ENCOUNTER — Other Ambulatory Visit: Payer: Self-pay | Admitting: *Deleted

## 2017-11-30 DIAGNOSIS — Z7689 Persons encountering health services in other specified circumstances: Secondary | ICD-10-CM

## 2017-11-30 NOTE — Telephone Encounter (Signed)
Left detailed message on named VM regarding pre-op scheduled for 12/29/17 at 1:45pm. Letter also mailed.

## 2017-12-07 ENCOUNTER — Ambulatory Visit: Payer: Self-pay

## 2017-12-10 ENCOUNTER — Telehealth: Payer: Self-pay | Admitting: *Deleted

## 2017-12-10 NOTE — Telephone Encounter (Signed)
Patient called and he states he wants to cancel his procedure. He reports he lost his insurance and wants to hold off for now. FYI to EG. Called [facility]olyn and made aware of cancellation.

## 2017-12-14 NOTE — Telephone Encounter (Signed)
Noted  

## 2017-12-15 ENCOUNTER — Telehealth: Payer: Self-pay

## 2017-12-15 NOTE — Telephone Encounter (Signed)
Called Dr. Deeann Saint office (ENT) to f/u on referral. They left message for pt but he hasn't returned call.  Called pt, gave him phone number to Dr. Deeann Saint office to schedule appt.

## 2017-12-29 ENCOUNTER — Other Ambulatory Visit (HOSPITAL_COMMUNITY): Payer: Self-pay

## 2018-01-06 ENCOUNTER — Ambulatory Visit (HOSPITAL_COMMUNITY): Admit: 2018-01-06 | Payer: Self-pay | Admitting: Internal Medicine

## 2018-01-06 ENCOUNTER — Encounter (HOSPITAL_COMMUNITY): Payer: Self-pay

## 2018-01-06 SURGERY — COLONOSCOPY WITH PROPOFOL
Anesthesia: Monitor Anesthesia Care

## 2018-01-11 ENCOUNTER — Other Ambulatory Visit: Payer: Self-pay | Admitting: Pediatrics

## 2018-01-11 DIAGNOSIS — F319 Bipolar disorder, unspecified: Secondary | ICD-10-CM

## 2018-01-12 NOTE — Telephone Encounter (Signed)
Last seen 08/06/17

## 2018-01-25 ENCOUNTER — Ambulatory Visit: Payer: Self-pay | Admitting: Gastroenterology

## 2018-03-21 ENCOUNTER — Telehealth: Payer: Self-pay | Admitting: *Deleted

## 2018-03-21 DIAGNOSIS — F319 Bipolar disorder, unspecified: Secondary | ICD-10-CM

## 2018-03-21 MED ORDER — QUETIAPINE FUMARATE 300 MG PO TABS: ORAL_TABLET | ORAL | 0 refills | Status: AC

## 2018-03-21 NOTE — Telephone Encounter (Signed)
Received phone call from patient stating that he lost his prescription of Seroquel and requesting a refill.  Last seen 4/26

## 2018-03-21 NOTE — Telephone Encounter (Signed)
Error.  Please see previous telephone call

## 2018-03-21 NOTE — Addendum Note (Signed)
Addended by: Eustaquio Maize on: 03/21/2018 03:57 PM   Modules accepted: Orders

## 2018-03-21 NOTE — Telephone Encounter (Signed)
Pt aware rx sent over. °

## 2018-05-17 ENCOUNTER — Telehealth: Payer: Self-pay | Admitting: Pediatrics

## 2018-05-17 NOTE — Telephone Encounter (Signed)
Sounds good. Thanks. WS

## 2018-05-17 NOTE — Telephone Encounter (Signed)
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  Advised patient that Dr. Evette Doffing is no longer practicing here, and he should have gotten a letter regarding this.  Advised him I can set him up with Dr. Livia Snellen as his new PCP and schedule him to discuss the letter and refills with Dr. Livia Snellen as he last saw Dr. Livia Snellen for his DOT physical in 2018. Patient is agreeable.  He has gotten an updated DOT physical provided by his new company.  He states he will be back from his orientation in [place] in 2 weeks.  Scheduled appointment for 05/31/2018.

## 2018-05-31 ENCOUNTER — Ambulatory Visit: Payer: Self-pay | Admitting: Family Medicine

## 2018-06-06 ENCOUNTER — Ambulatory Visit: Payer: Self-pay | Admitting: Family Medicine

## 2018-06-13 ENCOUNTER — Other Ambulatory Visit: Payer: Self-pay | Admitting: Pediatrics

## 2018-06-13 DIAGNOSIS — F319 Bipolar disorder, unspecified: Secondary | ICD-10-CM

## 2018-06-17 ENCOUNTER — Other Ambulatory Visit: Payer: Self-pay | Admitting: Pediatrics

## 2018-06-17 DIAGNOSIS — F319 Bipolar disorder, unspecified: Secondary | ICD-10-CM

## 2019-08-17 ENCOUNTER — Encounter: Payer: Self-pay | Admitting: Internal Medicine

## 2019-09-08 ENCOUNTER — Ambulatory Visit: Payer: Self-pay | Admitting: Gastroenterology

## 2019-09-08 ENCOUNTER — Encounter: Payer: Self-pay | Admitting: Internal Medicine

## 2020-08-07 ENCOUNTER — Other Ambulatory Visit: Payer: Self-pay

## 2020-08-07 DIAGNOSIS — Z87828 Personal history of other (healed) physical injury and trauma: Secondary | ICD-10-CM | POA: Insufficient documentation

## 2020-08-07 DIAGNOSIS — M1990 Unspecified osteoarthritis, unspecified site: Secondary | ICD-10-CM | POA: Insufficient documentation

## 2020-08-07 DIAGNOSIS — F419 Anxiety disorder, unspecified: Secondary | ICD-10-CM | POA: Insufficient documentation

## 2020-08-07 DIAGNOSIS — K6289 Other specified diseases of anus and rectum: Secondary | ICD-10-CM | POA: Insufficient documentation

## 2020-08-07 DIAGNOSIS — F410 Panic disorder [episodic paroxysmal anxiety] without agoraphobia: Secondary | ICD-10-CM | POA: Insufficient documentation

## 2020-08-07 DIAGNOSIS — F319 Bipolar disorder, unspecified: Secondary | ICD-10-CM | POA: Insufficient documentation

## 2020-08-07 DIAGNOSIS — N200 Calculus of kidney: Secondary | ICD-10-CM | POA: Insufficient documentation

## 2020-08-07 DIAGNOSIS — K648 Other hemorrhoids: Secondary | ICD-10-CM | POA: Insufficient documentation

## 2020-08-07 DIAGNOSIS — F209 Schizophrenia, unspecified: Secondary | ICD-10-CM | POA: Insufficient documentation

## 2020-08-07 DIAGNOSIS — G43909 Migraine, unspecified, not intractable, without status migrainosus: Secondary | ICD-10-CM | POA: Insufficient documentation

## 2020-08-07 DIAGNOSIS — R0981 Nasal congestion: Secondary | ICD-10-CM | POA: Insufficient documentation

## 2020-08-07 DIAGNOSIS — F411 Generalized anxiety disorder: Secondary | ICD-10-CM | POA: Insufficient documentation

## 2020-08-07 DIAGNOSIS — I10 Essential (primary) hypertension: Secondary | ICD-10-CM | POA: Insufficient documentation

## 2020-08-07 DIAGNOSIS — C44510 Basal cell carcinoma of anal skin: Secondary | ICD-10-CM | POA: Insufficient documentation

## 2020-08-07 DIAGNOSIS — E785 Hyperlipidemia, unspecified: Secondary | ICD-10-CM | POA: Insufficient documentation

## 2020-08-12 ENCOUNTER — Telehealth: Payer: Self-pay | Admitting: Cardiology

## 2020-08-12 NOTE — Telephone Encounter (Signed)
Called patient. [place] he understood no further questions.

## 2020-08-12 NOTE — Telephone Encounter (Signed)
Patient states he spoke with someone who was supposed to email him the address and the line was disconnected. Gave patient address verbally and he is requesting to also have it emailed to him.

## 2020-08-14 ENCOUNTER — Ambulatory Visit: Payer: Self-pay | Admitting: Cardiology

## 2020-09-05 ENCOUNTER — Other Ambulatory Visit: Payer: Self-pay

## 2020-09-06 ENCOUNTER — Encounter: Payer: Self-pay | Admitting: Cardiology

## 2020-09-06 ENCOUNTER — Ambulatory Visit (INDEPENDENT_AMBULATORY_CARE_PROVIDER_SITE_OTHER): Payer: Managed Care, Other (non HMO) | Admitting: Cardiology

## 2020-09-06 ENCOUNTER — Other Ambulatory Visit: Payer: Self-pay

## 2020-09-06 VITALS — BP 140/88 | HR 100 | Ht 68.0 in | Wt 249.0 lb

## 2020-09-06 DIAGNOSIS — R079 Chest pain, unspecified: Secondary | ICD-10-CM | POA: Diagnosis not present

## 2020-09-06 DIAGNOSIS — F331 Major depressive disorder, recurrent, moderate: Secondary | ICD-10-CM | POA: Insufficient documentation

## 2020-09-06 DIAGNOSIS — R9431 Abnormal electrocardiogram [ECG] [EKG]: Secondary | ICD-10-CM

## 2020-09-06 DIAGNOSIS — F319 Bipolar disorder, unspecified: Secondary | ICD-10-CM

## 2020-09-06 DIAGNOSIS — F17200 Nicotine dependence, unspecified, uncomplicated: Secondary | ICD-10-CM | POA: Insufficient documentation

## 2020-09-06 DIAGNOSIS — E782 Mixed hyperlipidemia: Secondary | ICD-10-CM | POA: Diagnosis not present

## 2020-09-06 DIAGNOSIS — R0600 Dyspnea, unspecified: Secondary | ICD-10-CM | POA: Diagnosis not present

## 2020-09-06 DIAGNOSIS — R0609 Other forms of dyspnea: Secondary | ICD-10-CM | POA: Insufficient documentation

## 2020-09-06 NOTE — Patient Instructions (Signed)
Medication Instructions:  Your physician recommends that you continue on your current medications as directed. Please refer to the Current Medication list given to you today.  *If you need a refill on your cardiac medications before your next appointment, please call your pharmacy*   Lab Work: None If you have labs (blood work) drawn today and your tests are completely normal, you will receive your results only by: Marland Kitchen MyChart Message (if you have MyChart) OR . A paper copy in the mail If you have any lab test that is abnormal or we need to change your treatment, we will call you to review the results.   Testing/Procedures: Your physician has requested that you have an echocardiogram. Echocardiography is a painless test that uses sound waves to create images of your heart. It provides your doctor with information about the size and shape of your heart and how well your heart's chambers and valves are working. This procedure takes approximately one hour. There are no restrictions for this procedure.    [facility] [address], [place], [place] [address] Phone: [phone]    Please arrive 15 minutes prior to your appointment time for registration and insurance purposes.  The test will take approximately 3 to 4 hours to complete; you may bring reading material.  If someone comes with you to your appointment, they will need to remain in the main lobby due to limited space in the testing area. **If you are pregnant or breastfeeding, please notify the nuclear lab prior to your appointment**  How to prepare for your Myocardial Perfusion Test: . Do not eat or drink 3 hours prior to your test, except you may have water. . Do not consume products containing caffeine (regular or decaffeinated) 12 hours prior to your test. (ex: coffee, chocolate, sodas, tea). . Do bring a list of your current medications with you.  If not listed below, you  may take your medications as normal.  . Do wear comfortable clothes (no dresses or overalls) and walking shoes, tennis shoes preferred (No heels or open toe shoes are allowed). . Do NOT wear cologne, perfume, aftershave, or lotions (deodorant is allowed). . If these instructions are not followed, your test will have to be rescheduled.  Please report to [address] for your test.  If you have questions or concerns about your appointment, you can call the Nuclear Lab at [phone].  If you cannot keep your appointment, please provide 24 hours notification to the Nuclear Lab, to avoid a possible $50 charge to your account.    Follow-Up: At [facility], you and your health needs are our priority.  As part of our continuing mission to provide you with exceptional heart care, we have created designated Provider Care Teams.  These Care Teams include your primary Cardiologist (physician) and Advanced Practice Providers (APPs -  Physician Assistants and Nurse Practitioners) who all work together to provide you with the care you need, when you need it.  We recommend signing up for the patient portal called "MyChart".  Sign up information is provided on this After Visit Summary.  MyChart is used to connect with patients for Virtual Visits (Telemedicine).  Patients are able to view lab/test results, encounter notes, upcoming appointments, etc.  Non-urgent messages can be sent to your provider as well.   To learn more about what you can do with MyChart, go to [email or web address].    Your next appointment:  2 month(s)  The format for your next appointment:   In Person  Provider:   Jenne Campus, MD   Other Instructions   Echocardiogram An echocardiogram is a test that uses sound waves (ultrasound) to produce images of the heart. Images from an echocardiogram can provide important information about:  Heart size and shape.  The size and thickness and movement of  your heart's walls.  Heart muscle function and strength.  Heart valve function or if you have stenosis. Stenosis is when the heart valves are too narrow.  If blood is flowing backward through the heart valves (regurgitation).  A tumor or infectious growth around the heart valves.  Areas of heart muscle that are not working well because of poor blood flow or injury from a heart attack.  Aneurysm detection. An aneurysm is a weak or damaged part of an artery wall. The wall bulges out from the normal force of blood pumping through the body. Tell a health care provider about:  Any allergies you have.  All medicines you are taking, including vitamins, herbs, eye drops, creams, and over-the-counter medicines.  Any blood disorders you have.  Any surgeries you have had.  Any medical conditions you have.  Whether you are pregnant or may be pregnant. What are the risks? Generally, this is a safe test. However, problems may occur, including an allergic reaction to dye (contrast) that may be used during the test. What happens before the test? No specific preparation is needed. You may eat and drink normally. What happens during the test?  You will take off your clothes from the waist up and put on a hospital gown.  Electrodes or electrocardiogram (ECG)patches may be placed on your chest. The electrodes or patches are then connected to a device that monitors your heart rate and rhythm.  You will lie down on a table for an ultrasound exam. A gel will be applied to your chest to help sound waves pass through your skin.  A handheld device, called a transducer, will be pressed against your chest and moved over your heart. The transducer produces sound waves that travel to your heart and bounce back (or "echo" back) to the transducer. These sound waves will be captured in real-time and changed into images of your heart that can be viewed on a video monitor. The images will be recorded on a  computer and reviewed by your health care provider.  You may be asked to change positions or hold your breath for a short time. This makes it easier to get different views or better views of your heart.  In some cases, you may receive contrast through an IV in one of your veins. This can improve the quality of the pictures from your heart. The procedure may vary among health care providers and hospitals.   What can I expect after the test? You may return to your normal, everyday life, including diet, activities, and medicines, unless your health care provider tells you not to do that. Follow these instructions at home:  It is up to you to get the results of your test. Ask your health care provider, or the department that is doing the test, when your results will be ready.  Keep all follow-up visits. This is important. Summary  An echocardiogram is a test that uses sound waves (ultrasound) to produce images of the heart.  Images from an echocardiogram can provide important information about the size and shape of your heart, heart muscle function, heart valve  function, and other possible heart problems.  You do not need to do anything to prepare before this test. You may eat and drink normally.  After the echocardiogram is completed, you may return to your normal, everyday life, unless your health care provider tells you not to do that. This information is not intended to replace advice given to you by your health care provider. Make sure you discuss any questions you have with your health care provider. Document Revised: 11/21/2019 Document Reviewed: 11/21/2019 Elsevier Patient Education  2021 [place].   Cardiac Nuclear Scan A cardiac nuclear scan is a test that measures blood flow to the heart when a person is resting and when he or she is exercising. The test looks for problems such as:  Not enough blood reaching a portion of the heart.  The heart muscle not working  normally. You may need this test if:  You have heart disease.  You have had abnormal lab results.  You have had heart surgery or a balloon procedure to open up blocked arteries (angioplasty).  You have chest pain.  You have shortness of breath. In this test, a radioactive dye (tracer) is injected into your bloodstream. After the tracer has traveled to your heart, an imaging device is used to measure how much of the tracer is absorbed by or distributed to various areas of your heart. This procedure is usually done at a hospital and takes 2-4 hours. Tell a health care provider about:  Any allergies you have.  All medicines you are taking, including vitamins, herbs, eye drops, creams, and over-the-counter medicines.  Any problems you or family members have had with anesthetic medicines.  Any blood disorders you have.  Any surgeries you have had.  Any medical conditions you have.  Whether you are pregnant or may be pregnant. What are the risks? Generally, this is a safe procedure. However, problems may occur, including:  Serious chest pain and heart attack. This is only a risk if the stress portion of the test is done.  Rapid heartbeat.  Sensation of warmth in your chest. This usually passes quickly.  Allergic reaction to the tracer. What happens before the procedure?  Ask your health care provider about changing or stopping your regular medicines. This is especially important if you are taking diabetes medicines or blood thinners.  Follow instructions from your health care provider about eating or drinking restrictions.  Remove your jewelry on the day of the procedure. What happens during the procedure?  An IV will be inserted into one of your veins.  Your health care provider will inject a small amount of radioactive tracer through the IV.  You will wait for 20-40 minutes while the tracer travels through your bloodstream.  Your heart activity will be monitored with  an electrocardiogram (ECG).  You will lie down on an exam table.  Images of your heart will be taken for about 15-20 minutes.  You may also have a stress test. For this test, one of the following may be done: ? You will exercise on a treadmill or stationary bike. While you exercise, your heart's activity will be monitored with an ECG, and your blood pressure will be checked. ? You will be given medicines that will increase blood flow to parts of your heart. This is done if you are unable to exercise.  When blood flow to your heart has peaked, a tracer will again be injected through the IV.  After 20-40 minutes, you will get back  on the exam table and have more images taken of your heart.  Depending on the type of tracer used, scans may need to be repeated 3-4 hours later.  Your IV line will be removed when the procedure is over. The procedure may vary among health care providers and hospitals. What happens after the procedure?  Unless your health care provider tells you otherwise, you may return to your normal schedule, including diet, activities, and medicines.  Unless your health care provider tells you otherwise, you may increase your fluid intake. This will help to flush the contrast dye from your body. Drink enough fluid to keep your urine pale yellow.  Ask your health care provider, or the department that is doing the test: ? When will my results be ready? ? How will I get my results? Summary  A cardiac nuclear scan measures the blood flow to the heart when a person is resting and when he or she is exercising.  Tell your health care provider if you are pregnant.  Before the procedure, ask your health care provider about changing or stopping your regular medicines. This is especially important if you are taking diabetes medicines or blood thinners.  After the procedure, unless your health care provider tells you otherwise, increase your fluid intake. This will help flush the  contrast dye from your body.  After the procedure, unless your health care provider tells you otherwise, you may return to your normal schedule, including diet, activities, and medicines. This information is not intended to replace advice given to you by your health care provider. Make sure you discuss any questions you have with your health care provider. Document Revised: 09/13/2017 Document Reviewed: 09/13/2017 Elsevier Patient Education  [place].

## 2020-09-06 NOTE — Progress Notes (Signed)
Cardiology Consultation:    Date:  09/06/2020   ID:  Larry Hamilton, DOB 1965-11-14, MRN [id number]  PCP:  Claretta Fraise, MD  Cardiologist:  Jenne Campus, MD   Referring MD: Larry Hamilton, [place]   Chief Complaint  Patient presents with  . Shortness of Breath    History of Present Illness:    Larry Hamilton is a 55 y.o. male who is being seen today for the evaluation of shortness of breath, abnormal EKG, chest pain at the request of Larry Hamilton, [place]*.  With past medical history significant for anxiety disorder, bipolar disorder, he was referred to our office because EKG done showed some possibility of anterior wall myocardial infarction.  Apparently discharges were He was referred to [place].  He is a poor historian but what I can get from his history is the fact that he is getting short of breath quite easily.  He also complained of having some uneasy sensation sometimes in the chest but he cannot describe specifically what he feels and he cannot describe circumstances of those episodes overall it is a poor history.  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  He does not exercise on the regular basis.  He does not stick with any diet.  He does have some family history of coronary artery disease but none of this is premature.  Apparently did have some evaluation before for chest pain I see some troponins done in the chart which were negative.  He tells me also that about 15 to 20 years ago he did have a stress test when he ran on the treadmill apparently stress test was normal.  Past Medical History:  Diagnosis Date  . Anxiety disorder CHEST PAIN  . Arthritis   . Basal cell carcinoma of anal skin   . Bipolar 1 disorder ([place]) 11/29/2015  . Bipolar disorder ([place])    [facility]  . Cocaine abuse ([place])   . Cocaine use disorder, moderate, dependence ([place]) 11/30/2015  . Depression   . Dysphagia, unspecified(787.20) 02/15/2013  . Family history of colon cancer  02/15/2013  . Hematochezia 11/29/2017  . Hemorrhoids 11/29/2017  . 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 Hyperlipidemia   . Hypertension    denies  . Internal hemorrhoid   . Kidney stones   . Migraine   . Nasal sinus congestion   . Panic attacks   . Preventative health care 11/29/2017  . Primary basal cell carcinoma of anus 03/18/2012  . Rectum pain   . Schizophrenia ([place])   . Suicidal ideation     Past Surgical History:  Procedure Laterality Date  . APPENDECTOMY  AGE 43  . COLONOSCOPY  2006   Dr. Rowe Pavy: normal colonoscopy.  . COLONOSCOPY N/A 03/06/2013   Dr. Gala Romney- friable hemorrhoids;o/w negative colonoscopy  . ESOPHAGOGASTRODUODENOSCOPY (EGD) WITH ESOPHAGEAL DILATION N/A 03/06/2013   Dr. Gala Romney- erosive/ulcerative reflux esophagitis. reactive gastopathy with mild chronic inflamation on bx.  Marland Kitchen HEMORRHOID SURGERY  03/21/2012   Procedure: HEMORRHOIDECTOMY;  Surgeon: Leighton Ruff, MD;  [facility];  Service: General;  Laterality: N/A;  . KNEE SURGERY    . LEFT KNEE SURGERY  AGE 69  . MOUTH SURGERY    . RECTAL BIOPSY  03/21/2012   Procedure: BIOPSY RECTAL;  Surgeon: Leighton Ruff, MD;  Location: [facility];  Service: General;  Laterality: N/A;  WIDE LOCAL EXCISON OF ANAL MASS, POSSIBLE HEMORRHOIDECTOMY  . TEETH EXTRACTIONS AND EXCISION MOUTH LESION  AGE 76  . TRANSANAL EXCISION OF RECTAL MASS  04/07/2012   Procedure: TRANSANAL EXCISION OF RECTAL MASS;  Surgeon: Leighton Ruff, MD;  Location: [facility];  Service: General;  Laterality: N/A;  Wide Local Excision of Perianal Mass    Current Medications: Current Meds  Medication Sig  . ALPRAZolam (XANAX) 0.5 MG tablet Take 0.5 mg by mouth 2 (two) times daily.  Marland Kitchen atorvastatin (LIPITOR) 20 MG tablet Take 1 tablet by mouth daily.  Marland Kitchen lisinopril (ZESTRIL) 10 MG tablet Take 1 tablet by mouth daily.  . QUEtiapine (SEROQUEL) 300 MG  tablet TAKE 1 TABLET BY MOUTH AT BEDTIME FOR  MOOD  CONTROL (Patient taking differently: Take 300 mg by mouth at bedtime. TAKE 1 TABLET BY MOUTH AT BEDTIME FOR  MOOD  CONTROL)     Allergies:   Rocephin [ceftriaxone sodium in dextrose] and Flomax [tamsulosin hcl]   Social History   Socioeconomic History  . Marital status: Divorced    Spouse name: Not on file  . Number of children: Not on file  . Years of education: Not on file  . Highest education level: Not on file  Occupational History  . Not on file  Tobacco Use  . Smoking status: Former Smoker    Types: Cigarettes  . Smokeless tobacco: Current User    Types: Snuff, Chew  . Tobacco comment: DIP AND CHEW TOBACCO FOR 40 YRS/ SMOKING UNTIL AGE 77 APPROX. >5 YRS  (PER PT DENIES SMOKING AT THIS TIME)  Substance and Sexual Activity  . Alcohol use: Yes    Comment: Rare: 12 pack of beer in a year  . Drug use: Yes    Types: Cocaine, Marijuana    Comment: Last cocaine 1 year ago, no marijuana in 2-3 years.  . Sexual activity: Not on file  Other Topics Concern  . Not on file  Social History Narrative  . Not on file   Social Determinants of Health   Financial Resource Strain: Not on file  Food Insecurity: Not on file  Transportation Needs: Not on file  Physical Activity: Not on file  Stress: Not on file  Social Connections: Not on file     Family History: The patient's family history includes Alcoholism in his brother and father; Cancer in his father; Colon cancer in his father. ROS:   Please see the history of present illness.    All 14 point review of systems negative except as described per history of present illness.  EKGs/Labs/Other Studies Reviewed:    The following studies were reviewed today:   EKG:  EKG is  ordered today.  The ekg ordered today demonstrates normal sinus rhythm normal P interval poor R wave progression anterior precordium raising suspicion for anterior septal wall myocardial infarction, there is a  Q wave but only in lead III.  Recent Labs: No results found for requested labs within last 8760 hours.  Recent Lipid Panel    Component Value Date/Time   CHOL 209 (H) 03/18/2016 1019   TRIG 272 (H) 03/18/2016 1019   HDL 44 03/18/2016 1019   CHOLHDL 4.8 03/18/2016 1019   CHOLHDL 4.9 12/01/2015 0627   VLDL 21 12/01/2015 0627   LDLCALC 111 (H) 03/18/2016 1019    Physical Exam:    VS:  BP 140/88 (BP Location: Right Arm, Patient Position: Sitting)   Pulse 100   Ht 5\' 8"  (1.727 m)  Wt 249 lb (112.9 kg)   SpO2 97%   BMI 37.86 kg/m     Wt Readings from Last 3 Encounters:  09/06/20 249 lb (112.9 kg)  11/29/17 232 lb 3.2 oz (105.3 kg)  08/06/17 234 lb (106.1 kg)     GEN:  Well nourished, well developed in no acute distress HEENT: Normal NECK: No JVD; No carotid bruits LYMPHATICS: No lymphadenopathy CARDIAC: RRR, no murmurs, no rubs, no gallops RESPIRATORY:  Clear to auscultation without rales, wheezing or rhonchi  ABDOMEN: Soft, non-tender, non-distended MUSCULOSKELETAL:  No edema; No deformity  SKIN: Warm and dry NEUROLOGIC:  Alert and oriented x 3 PSYCHIATRIC:  Normal affect   ASSESSMENT:    1. Chest pain of uncertain etiology   2. Nonspecific abnormal electrocardiogram (ECG) (EKG)   3. Mixed hyperlipidemia   4. Dyspnea on exertion   5. Bipolar 1 disorder ([place])    PLAN:    In order of problems listed above:  1. Chest pain relieved very poor historian difficult to get good story from him.  I think is reasonable will be to perform exercise Cardiolite to see if he truly got any inducible ischemia. 2. Dyspnea on exertion multifactorial he said he does not smoke I will schedule him to have an echocardiogram to assess left ventricle ejection fraction and look for segmental motion abnormality as well as assess his valve valve function. 3. Mixed dyslipidemia I did review K PN which show me his LDL at 111 HDL 44 this is from 03/18/2016.  He tells me that he just got  cholesterol checked recently we will try to get report of it. 4. Bipolar disorder he does have some psychiatrist to follow. 5.  6. Overall is a difficult historian.  Clearly he is required echocardiogram as well as assessment of his coronary arteries.  I asked him to start taking 1 baby aspirin every single day.  We will call primary care physician to get his fasting lipid profile to decide about anticholesterol therapy.   Medication Adjustments/Labs and Tests Ordered: Current medicines are reviewed at length with the patient today.  Concerns regarding medicines are outlined above.  Orders Placed This Encounter  Procedures  . MYOCARDIAL PERFUSION IMAGING  . EKG 12-Lead  . ECHOCARDIOGRAM COMPLETE   No orders of the defined types were placed in this encounter.   Signed, Park Liter, MD, [facility]. 09/06/2020 4:08 PM    [facility]

## 2020-09-06 NOTE — Progress Notes (Signed)
ekg 

## 2020-09-10 ENCOUNTER — Telehealth (HOSPITAL_COMMUNITY): Payer: Self-pay | Admitting: *Deleted

## 2020-09-10 ENCOUNTER — Encounter (HOSPITAL_COMMUNITY): Payer: Self-pay | Admitting: *Deleted

## 2020-09-10 NOTE — Telephone Encounter (Signed)
Instruction letter for nuclear stress test on 09/13/20 sent via [place].

## 2020-09-13 ENCOUNTER — Other Ambulatory Visit: Payer: Self-pay

## 2020-09-13 ENCOUNTER — Ambulatory Visit (HOSPITAL_COMMUNITY): Payer: Managed Care, Other (non HMO) | Attending: Cardiovascular Disease

## 2020-09-13 DIAGNOSIS — R079 Chest pain, unspecified: Secondary | ICD-10-CM | POA: Diagnosis not present

## 2020-09-13 DIAGNOSIS — R9431 Abnormal electrocardiogram [ECG] [EKG]: Secondary | ICD-10-CM | POA: Diagnosis not present

## 2020-09-13 LAB — MYOCARDIAL PERFUSION IMAGING
LV dias vol: 66 mL (ref 62–150)
LV sys vol: 25 mL
Peak HR: 113 {beats}/min
Rest HR: 86 {beats}/min
SDS: 1
SRS: 0
SSS: 1
TID: 0.79

## 2020-09-13 MED ORDER — TECHNETIUM TC 99M TETROFOSMIN IV KIT
31.3000 | PACK | Freq: Once | INTRAVENOUS | Status: AC | PRN
  Administered 2020-09-13: 31.3 via INTRAVENOUS
  Filled 2020-09-13: qty 32

## 2020-09-13 MED ORDER — REGADENOSON 0.4 MG/5ML IV SOLN
0.4000 mg | Freq: Once | INTRAVENOUS | Status: AC
  Administered 2020-09-13: 0.4 mg via INTRAVENOUS

## 2020-09-13 MED ORDER — TECHNETIUM TC 99M TETROFOSMIN IV KIT
10.1000 | PACK | Freq: Once | INTRAVENOUS | Status: AC | PRN
Start: 2020-09-13 — End: 2020-09-13
  Administered 2020-09-13: 10.1 via INTRAVENOUS
  Filled 2020-09-13: qty 11

## 2020-09-16 ENCOUNTER — Telehealth: Payer: Self-pay | Admitting: Cardiology

## 2020-09-16 NOTE — Telephone Encounter (Signed)
Olanrewaju is returning Hayley's call. States he has been on edge about receiving these results since Friday due to not knowing and continuing to miss each other. Please advise.

## 2020-09-16 NOTE — Telephone Encounter (Signed)
Tried to return call number busy.

## 2020-09-16 NOTE — Telephone Encounter (Signed)
Left message with patient mother for patient to return call.

## 2020-09-16 NOTE — Telephone Encounter (Signed)
Called patient. Informed him of stress test results. 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 But will check with Dr. Agustin Cree to see if he is ok for him to return to work as the patient will need a letter saying it is ok for him to return.

## 2020-09-16 NOTE — Telephone Encounter (Signed)
Pt is returning call from Friday in regards to his results from his most recent test. Pt does not have access to [place] at this time. Pt does not have a phone at this time and he is on his way to his mothers house, please call this number [phone] to speak with patient

## 2020-09-18 NOTE — Telephone Encounter (Signed)
Left message for patient to return call.

## 2020-09-20 NOTE — Telephone Encounter (Signed)
Left message for patient to return call.

## 2020-09-23 NOTE — Telephone Encounter (Signed)
Pt returning your phone call... please advise

## 2020-09-23 NOTE — Telephone Encounter (Signed)
Patient informed not to drive until echo results come in.

## 2020-10-04 ENCOUNTER — Ambulatory Visit (HOSPITAL_BASED_OUTPATIENT_CLINIC_OR_DEPARTMENT_OTHER)
Admission: RE | Admit: 2020-10-04 | Discharge: 2020-10-04 | Disposition: A | Discharge status: Home / Self Care | Payer: Managed Care, Other (non HMO) | Source: Ambulatory Visit | Attending: Cardiology | Admitting: Cardiology

## 2020-10-04 ENCOUNTER — Other Ambulatory Visit: Payer: Self-pay

## 2020-10-04 DIAGNOSIS — R079 Chest pain, unspecified: Secondary | ICD-10-CM

## 2020-10-04 DIAGNOSIS — R9431 Abnormal electrocardiogram [ECG] [EKG]: Secondary | ICD-10-CM | POA: Insufficient documentation

## 2020-10-04 LAB — ECHOCARDIOGRAM COMPLETE
AR max vel: 3.74 cm2
AV Area VTI: 3.29 cm2
AV Area mean vel: 3.77 cm2
AV Mean grad: 3 mmHg
AV Peak grad: 6.7 mmHg
Ao pk vel: 1.29 m/s
Area-P 1/2: 4.1 cm2
Calc EF: 66.3 %
S' Lateral: 2.34 cm
Single Plane A2C EF: 57.7 %
Single Plane A4C EF: 73 %

## 2020-10-07 ENCOUNTER — Telehealth: Payer: Self-pay | Admitting: Emergency Medicine

## 2020-10-07 NOTE — Telephone Encounter (Signed)
Called patient with echo results. He wants to be released to go back to work. Will check with Dr. Agustin Cree.

## 2020-10-08 ENCOUNTER — Other Ambulatory Visit (HOSPITAL_BASED_OUTPATIENT_CLINIC_OR_DEPARTMENT_OTHER): Payer: Managed Care, Other (non HMO)

## 2020-10-08 ENCOUNTER — Telehealth: Payer: Self-pay | Admitting: Cardiology

## 2020-10-08 NOTE — Telephone Encounter (Signed)
Patient called and stated that he need a release form so that he can go back to work. Please fax to [phone] or [email or web address]

## 2020-10-09 NOTE — Telephone Encounter (Signed)
Patient calling again to follow up on his request to go back to work.

## 2020-10-10 ENCOUNTER — Encounter: Payer: Self-pay | Admitting: Emergency Medicine

## 2020-10-10 NOTE — Telephone Encounter (Signed)
Letter faxed.

## 2020-10-10 NOTE — Telephone Encounter (Signed)
See additional encounter

## 2020-10-11 NOTE — Telephone Encounter (Signed)
Follow Up:    Pt calling to say he need his note for work to be faxed to [employer] at [phone] and Safety [phone]

## 2020-10-11 NOTE — Telephone Encounter (Signed)
See additional encounter

## 2020-10-11 NOTE — Telephone Encounter (Signed)
Patient states they have not received the note yet. He states it can be faxed to [phone] or an email can  be sent to [email or web address]

## 2020-10-11 NOTE — Telephone Encounter (Signed)
Letter faxed again to all numbers listed.

## 2020-11-01 ENCOUNTER — Ambulatory Visit: Payer: Managed Care, Other (non HMO) | Admitting: Cardiology

## 2021-01-14 ENCOUNTER — Ambulatory Visit: Payer: Self-pay | Admitting: Cardiology

## 2021-04-25 ENCOUNTER — Other Ambulatory Visit (HOSPITAL_BASED_OUTPATIENT_CLINIC_OR_DEPARTMENT_OTHER): Payer: Self-pay

## 2021-04-25 DIAGNOSIS — G4700 Insomnia, unspecified: Secondary | ICD-10-CM

## 2021-04-25 DIAGNOSIS — R0683 Snoring: Secondary | ICD-10-CM

## 2021-04-25 DIAGNOSIS — R5383 Other fatigue: Secondary | ICD-10-CM

## 2021-07-11 ENCOUNTER — Ambulatory Visit: Payer: Medicaid Other | Attending: Psychiatry | Admitting: Neurology

## 2021-07-11 DIAGNOSIS — R0683 Snoring: Secondary | ICD-10-CM

## 2021-07-11 DIAGNOSIS — G4733 Obstructive sleep apnea (adult) (pediatric): Secondary | ICD-10-CM | POA: Diagnosis present

## 2021-07-11 DIAGNOSIS — G4700 Insomnia, unspecified: Secondary | ICD-10-CM

## 2021-07-11 DIAGNOSIS — R5383 Other fatigue: Secondary | ICD-10-CM

## 2021-07-18 NOTE — Procedures (Signed)
?  [place] ?Amellia Panik A. Merlene Laughter, MD     [email or web address] ?       ? ?    NOCTURNAL POLYSOMNOGRAPHY  ? ?LOCATION: [facility] ? ? ?Patient Name: Larry Hamilton, Larry Hamilton ?Study Date: 07/11/2021 ?Gender: Male ?D.O.B: [birth year] ?Age (years): 38 ?Referring Provider: Wilburn Cornelia Register MD ?Height (inches): 68 ?Interpreting Physician: Phillips Odor MD, ABSM ?Weight (lbs): 249 ?RPSGT: Peak, Acelin ?BMI: 38 ?MRN: [id number] ?Neck Size: 19.00 ?CLINICAL INFORMATION ?Sleep Study Type: NPSG ? ?  ? ?Indication for sleep study: N/A ? ?  ? ?Epworth Sleepiness Score: N/A ? ?  ? ?SLEEP STUDY TECHNIQUE ?As per the AASM Manual for the Scoring of Sleep and Associated Events v2.3 (April 2016) with a hypopnea requiring 4% desaturations. ? ?The channels recorded and monitored were frontal, central and occipital EEG, electrooculogram (EOG), submentalis EMG (chin), nasal and oral airflow, thoracic and abdominal wall motion, anterior tibialis EMG, snore microphone, electrocardiogram, and pulse oximetry. ? ?MEDICATIONS ?Medications self-administered by patient taken the night of the study : N/A ? ?Current Outpatient Medications:  ?  ALPRAZolam (XANAX) 0.5 MG tablet, Take 0.5 mg by mouth 2 (two) times daily., Disp: , Rfl:  ?  atorvastatin (LIPITOR) 20 MG tablet, Take 1 tablet by mouth daily., Disp: , Rfl:  ?  lisinopril (ZESTRIL) 10 MG tablet, Take 1 tablet by mouth daily., Disp: , Rfl:  ?  QUEtiapine (SEROQUEL) 300 MG tablet, TAKE 1 TABLET BY MOUTH AT BEDTIME FOR  MOOD  CONTROL (Patient taking differently: Take 300 mg by mouth at bedtime. TAKE 1 TABLET BY MOUTH AT BEDTIME FOR  MOOD  CONTROL), Disp: 90 tablet, Rfl: 0 ? ?  ? ?SLEEP ARCHITECTURE ?The study was initiated at 10:28:48 PM and ended at 5:11:33 AM. ? ?Sleep onset time was 63.4 minutes and the sleep efficiency was 80.4%. The total sleep time was 323.9 minutes. ? ?Stage REM latency was 84.0 minutes. ? ?The patient spent 5.40% of the night in stage N1 sleep, 86.15% in stage N2 sleep,  0.00% in stage N3 and 8.5% in REM. ? ?Alpha intrusion was absent. ? ?Supine sleep was 0.00%. ? ?RESPIRATORY PARAMETERS ?The overall apnea/hypopnea index (AHI) was 16.9 per hour. There were 24 total apneas, including 4 obstructive, 20 central and 0 mixed apneas. There were 67 hypopneas and 1 RERAs. ? ?The AHI during Stage REM sleep was 17.5 per hour. ? ?AHI while supine was N/A per hour. ? ?The mean oxygen saturation was 93.09%. The minimum SpO2 during sleep was 82.00%. ? ?loud snoring was noted during this study. ? ?CARDIAC DATA ?The 2 lead EKG demonstrated sinus rhythm. The mean heart rate was 83.60 beats per minute. Other EKG findings include: None. ? ?LEG MOVEMENT DATA ?The total PLMS were 0 with a resulting PLMS index of 0.00. Associated arousal with leg movement index was 0.0. ? ?IMPRESSIONS ?Moderate obstructive sleep apnea is documented with this study. AutoPap 9-20 is recommended.  ?Absent slow wave sleep is also noted. ? ? ? ? ?Delano Metz, MD ?Diplomate, American Board of Sleep Medicine. ? ?ELECTRONICALLY SIGNED ON:  07/18/2021, 4:46 PM ?[place] ?PH: [phone]   FX: [phone] ?ACCREDITED BY THE AMERICAN ACADEMY OF SLEEP MEDICINE ? ?

## 2021-08-21 ENCOUNTER — Encounter: Payer: Self-pay | Admitting: Internal Medicine

## 2021-09-09 ENCOUNTER — Ambulatory Visit: Payer: Medicaid Other | Admitting: Internal Medicine

## 2021-09-11 ENCOUNTER — Ambulatory Visit (INDEPENDENT_AMBULATORY_CARE_PROVIDER_SITE_OTHER): Payer: Medicaid Other | Admitting: Orthopaedic Surgery

## 2021-09-11 DIAGNOSIS — G8929 Other chronic pain: Secondary | ICD-10-CM

## 2021-09-11 DIAGNOSIS — M545 Low back pain, unspecified: Secondary | ICD-10-CM | POA: Diagnosis not present

## 2021-09-11 DIAGNOSIS — M542 Cervicalgia: Secondary | ICD-10-CM

## 2021-09-11 NOTE — Progress Notes (Signed)
Office Visit Note   Patient: Larry Hamilton           Date of Birth: Jun 29, 1965           MRN: [id number] Visit Date: 09/11/2021              Requested by: Olga Coaster, [address],  [place] [address] PCP: Olga Coaster, FNP   Assessment & Plan: Visit Diagnoses:  1. Chronic neck pain   2. Chronic low back pain without sciatica, unspecified back pain laterality      Plan: We will set patient up for some physical therapy recheck in 7 weeks.  Follow-Up Instructions: Return in about 7 weeks (around 10/30/2021).   Orders:  No orders of the defined types were placed in this encounter.  No orders of the defined types were placed in this encounter.     Procedures: No procedures performed   Clinical Data: No additional findings.   Subjective: Chief Complaint  Patient presents with   Neck - Pain    When I turn my neck it hurts more on left side than right.   Lower Back - Pain    My back hurts on the left side. I have used Flexeril, Aleve and Ibuprofen and they didn't help.    HPI 56 year old male with chronic greater than 10-year history of neck pain and back pain more pain on the left side patient states he has worse problems with his neck and lower back pain more on the left side of his neck when he turns or rotates he notes noise when he moves his neck pain radiates down sometimes to his hand.  He is taken Aleve, ibuprofen, Flexeril without improvement.  No myelopathic symptoms.  Back tends to bother him more on the left side adjacent to the iliac crest.  No associated bowel or bladder symptoms.  Past history general anxiety bipolar tobacco dependence depression.  Review of Systems negative chills or fever no myelopathic symptoms.   Objective: Vital Signs: Ht '5\' 8"'$  (1.727 m)   Wt 244 lb (110.7 kg)   BMI 37.10 kg/m   Physical Exam Constitutional:      Appearance: He is well-developed.  HENT:     Head: Normocephalic and atraumatic.     Right Ear:  External ear normal.     Left Ear: External ear normal.  Eyes:     Pupils: Pupils are equal, round, and reactive to light.  Neck:     Thyroid: No thyromegaly.     Trachea: No tracheal deviation.  Cardiovascular:     Rate and Rhythm: Normal rate.  Pulmonary:     Effort: Pulmonary effort is normal.     Breath sounds: No wheezing.  Abdominal:     General: Bowel sounds are normal.     Palpations: Abdomen is soft.  Musculoskeletal:     Cervical back: Neck supple.  Skin:    General: Skin is warm and dry.     Capillary Refill: Capillary refill takes less than 2 seconds.  Neurological:     Mental Status: He is alert and oriented to person, place, and time.  Psychiatric:        Behavior: Behavior normal.        Thought Content: Thought content normal.        Judgment: Judgment normal.    Ortho Exam patient has some brachial plexus tenderness left greater than right increased pain with neck extension he  can flex chin to chest.  Upper extremity reflexes are 1+ and symmetrical negative logroll of the hips lower extremity reflexes are 1+.  Trochanteric bursa hip range of motion is normal.  Specialty Comments:  No specialty comments available.  Imaging: Impression  Negative cervical spine radiographs.    Electronically Signed    By: Jerilynn Mages.  Shick M.D.    On: 08/28/2021 13:21 Narrative  CLINICAL DATA:  Chronic neck pain   EXAM:  CERVICAL SPINE - COMPLETE 4+ VIEW   COMPARISON:  None Available.   FINDINGS:  There is no evidence of cervical spine fracture or prevertebral soft  tissue swelling. Alignment is normal. No other significant bone   Impression  Minor endplate degenerative changes. No acute finding by plain  radiography.    Electronically Signed    By: Jerilynn Mages.  Shick M.D.    On: 08/28/2021 13:25 Narrative  CLINICAL DATA:  Low back pain   EXAM:  LUMBAR SPINE - 2-3 VIEW   COMPARISON:  None Available.   FINDINGS:  Normal alignment. Preserved vertebral body heights.  No acute  compression fracture, wedge-shaped deformity or focal kyphosis.  Minor endplate bony spurring and sclerosis anteriorly at multiple  levels. Slight lumbar facet arthropathy at L4-5 and L5-S1.  Transitional S1 segment noted. Normal appearing pedicles and SI  joints. Procedure Note  Gasper Sells, MD - 08/28/2021  Formatting of this note might be different from the original.  CLINICAL DATA:  Low back pain   EXAM:  LUMBAR SPINE - 2-3 VIEW   COMPARISON:  None Available.   FINDINGS:  Normal alignment. Preserved vertebral body heights. No acute  compression fracture, wedge-shaped deformity or focal kyphosis.  Minor endplate bony spurring and sclerosis anteriorly at multiple  levels. Slight lumbar facet arthropathy at L4-5 and L5-S1.  Transitional S1 segment noted. Normal appearing pedicles and SI  joints.   IMPRESSION:  Minor endplate degenerative changes. No acute finding by plain  radiography.    Electronically Signed    By: Jerilynn Mages.  Shick M.D.    On: 08/28/2021 13:25 PMFS History: Patient Active Problem List   Diagnosis Date Noted   Chronic neck pain 09/11/2021   Low back pain 09/11/2021   Moderate recurrent major depression ([place]) 09/06/2020   Tobacco dependence 09/06/2020   Nonspecific abnormal electrocardiogram (ECG) (EKG) 09/06/2020   Dyspnea on exertion 09/06/2020   Schizophrenia ([facility])    Rectum pain    Migraine    Panic attacks    Nasal sinus congestion    Kidney stones    Internal hemorrhoid    Hypertension    Hyperlipidemia    History of head injury    Bipolar disorder, unspecified ([place])    Arthritis    Generalized anxiety disorder    Hematochezia 11/29/2017   Hemorrhoids 11/29/2017   Preventative health care 11/29/2017   Cocaine abuse ([place])    Depression    Suicidal ideation    Cocaine use disorder, moderate, dependence ([place]) 11/30/2015   Bipolar 1 disorder ([place]) 11/29/2015   Dysphagia, unspecified(787.20) 02/15/2013   Family history of  colon cancer 02/15/2013   Primary basal cell carcinoma of anus 03/18/2012   Past Medical History:  Diagnosis Date   Anxiety disorder CHEST PAIN   Arthritis    Basal cell carcinoma of anal skin    Bipolar 1 disorder ([place]) 11/29/2015   Bipolar disorder ([place])    [facility]   Cocaine abuse ([place])    Cocaine use disorder, moderate, dependence ([place]) 11/30/2015   Depression  Dysphagia, unspecified(787.20) 02/15/2013   Family history of colon cancer 02/15/2013   Hematochezia 11/29/2017   Hemorrhoids 11/29/2017   History of head injury AGE 89--  CLOSED HEAD INJURY W/ HEMATOMA AND POSSIBLE SKULL FX--  RESIDUAL HEADACHES   Hyperlipidemia    Hypertension    denies   Internal hemorrhoid    Kidney stones    Migraine    Nasal sinus congestion    Panic attacks    Preventative health care 11/29/2017   Primary basal cell carcinoma of anus 03/18/2012   Rectum pain    Schizophrenia ([place])    Suicidal ideation     Family History  Problem Relation Age of Onset   Cancer Father        Leukemia   Colon cancer Father        10s   Alcoholism Father    Alcoholism Brother     Past Surgical History:  Procedure Laterality Date   APPENDECTOMY  AGE 6   COLONOSCOPY  2006   Dr. Rowe Pavy: normal colonoscopy.   COLONOSCOPY N/A 03/06/2013   Dr. Gala Romney- friable hemorrhoids;o/w negative colonoscopy   ESOPHAGOGASTRODUODENOSCOPY (EGD) WITH ESOPHAGEAL DILATION N/A 03/06/2013   Dr. Gala Romney- erosive/ulcerative reflux esophagitis. reactive gastopathy with mild chronic inflamation on bx.   HEMORRHOID SURGERY  03/21/2012   Procedure: HEMORRHOIDECTOMY;  Surgeon: Leighton Ruff, MD;  Location: [facility];  Service: General;  Laterality: N/A;   KNEE SURGERY     LEFT KNEE SURGERY  AGE 33   MOUTH SURGERY     RECTAL BIOPSY  03/21/2012   Procedure: BIOPSY RECTAL;  Surgeon: Leighton Ruff, MD;  Location: [facility];  Service: General;  Laterality: N/A;  WIDE LOCAL EXCISON OF ANAL MASS, POSSIBLE  HEMORRHOIDECTOMY   TEETH EXTRACTIONS AND EXCISION MOUTH LESION  AGE 54   TRANSANAL EXCISION OF RECTAL MASS  04/07/2012   Procedure: TRANSANAL EXCISION OF RECTAL MASS;  Surgeon: Leighton Ruff, MD;  Location: [facility];  Service: General;  Laterality: N/A;  Wide Local Excision of Perianal Mass   Social History   Occupational History   Not on file  Tobacco Use   Smoking status: Former    Types: Cigarettes   Smokeless tobacco: Current    Types: Snuff, Chew   Tobacco comments:    DIP AND CHEW TOBACCO FOR 40 YRS/ SMOKING UNTIL AGE 89 APPROX. >5 YRS  (PER PT DENIES SMOKING AT THIS TIME)  Substance and Sexual Activity   Alcohol use: Yes    Comment: Rare: 12 pack of beer in a year   Drug use: Yes    Types: Cocaine, Marijuana    Comment: Last cocaine 1 year ago, no marijuana in 2-3 years.   Sexual activity: Not on file

## 2021-10-03 ENCOUNTER — Ambulatory Visit: Payer: Medicaid Other | Admitting: Internal Medicine

## 2021-10-21 ENCOUNTER — Ambulatory Visit: Payer: Medicaid Other | Admitting: Internal Medicine

## 2021-10-21 ENCOUNTER — Encounter: Payer: Self-pay | Admitting: Internal Medicine

## 2021-11-18 ENCOUNTER — Ambulatory Visit: Payer: Medicaid Other | Admitting: Internal Medicine

## 2021-12-02 ENCOUNTER — Ambulatory Visit: Payer: Medicaid Other | Admitting: Internal Medicine

## 2021-12-03 ENCOUNTER — Ambulatory Visit: Payer: Medicaid Other | Admitting: Cardiology

## 2021-12-18 ENCOUNTER — Telehealth: Payer: Self-pay | Admitting: Gastroenterology

## 2021-12-18 NOTE — Telephone Encounter (Signed)
Pt recommended to schedule OV before TCS.  I called him to schedule an appt but he was sleeping and said he would look at his schedule and call us back.

## 2022-02-01 NOTE — Progress Notes (Deleted)
Cardiology Office Note    Date:  02/01/2022   ID:  Larry Hamilton, DOB [birth year], MRN [id number]  PCP:  Olga Coaster, FNP  Cardiologist:  Dr. Agustin Cree Electrophysiologist:  None   Chief Complaint: ***  History of Present Illness:   Larry Hamilton is a 56 y.o. male with history of anxiety, bipolar disorder, arthritis, prior cocaine abuse, depression, HTN, HLD, kidney stones, migraines, schiozphrenia who is seen for evaluation of chest pain. He established with Dr. Agustin Cree in 08/2020 for chest pain and abnormal EKG. He was described as a poor historian in those notes. NST 09/2020 was normal and echo reassuring with EF 60-65%, G1DD. He was seen at an outside ED 07/2021 for chest pain and 1/3 troponins were abnormal. Outpatient follow-up was recommended.  Chest discomfort Essential HTN Hyperlipidemia Diastolic dysfunction without heart failure  Labwork independently reviewed: 07/2021 hsTroponins 48-57 (ref range <53)-50, UDS +bz, H/H/plt ok,K 3.5, Cr 0.85, LFTs not up, CK wnl, pBNP wnl  Cardiology Studies:   Studies reviewed are outlined and summarized above. Reports included below if pertinent.   Echo 09/2020   1. Left ventricular ejection fraction, by estimation, is 60 to 65%. The  left ventricle has normal function. The left ventricle has no regional  wall motion abnormalities. Left ventricular diastolic parameters are  consistent with Grade I diastolic  dysfunction (impaired relaxation).   2. Right ventricular systolic function is normal. The right ventricular  size is normal. There is normal pulmonary artery systolic pressure.   3. The mitral valve is normal in structure. No evidence of mitral valve  regurgitation. No evidence of mitral stenosis.   4. The aortic valve is normal in structure. Aortic valve regurgitation is  not visualized. No aortic stenosis is present.   5. The inferior vena cava is normal in size with greater than 50%  respiratory variability,  suggesting right atrial pressure of 3 mmHg.    Nuc 09/2020 Nuclear stress EF: 62%. The left ventricular ejection fraction is normal (55-65%). There was no ST segment deviation noted during stress. No T wave inversion was noted during stress. The study is normal. This is a low risk study.   1. Normal MPI study without ischemia or infarction.  2. Normal LVEF, 62%.  3. This is a low risk study.     Past Medical History:  Diagnosis Date   Anxiety disorder CHEST PAIN   Arthritis    Basal cell carcinoma of anal skin    Bipolar 1 disorder ([place]) 11/29/2015   Bipolar disorder ([place])    [facility]   Cocaine abuse ([place])    Cocaine use disorder, moderate, dependence ([place]) 11/30/2015   Depression    Dysphagia, unspecified(787.20) 02/15/2013   Family history of colon cancer 02/15/2013   Hematochezia 11/29/2017   Hemorrhoids 11/29/2017   History of head injury AGE 35--  CLOSED HEAD INJURY W/ HEMATOMA AND POSSIBLE SKULL FX--  RESIDUAL HEADACHES   Hyperlipidemia    Hypertension    denies   Internal hemorrhoid    Kidney stones    Migraine    Nasal sinus congestion    Panic attacks    Preventative health care 11/29/2017   Primary basal cell carcinoma of anus 03/18/2012   Rectum pain    Schizophrenia ([place])    Suicidal ideation     Past Surgical History:  Procedure Laterality Date   APPENDECTOMY  AGE 24   COLONOSCOPY  2006   Dr. Rowe Pavy: normal colonoscopy.   COLONOSCOPY  N/A 03/06/2013   Dr. Gala Romney- friable hemorrhoids;o/w negative colonoscopy   ESOPHAGOGASTRODUODENOSCOPY (EGD) WITH ESOPHAGEAL DILATION N/A 03/06/2013   Dr. Gala Romney- erosive/ulcerative reflux esophagitis. reactive gastopathy with mild chronic inflamation on bx.   HEMORRHOID SURGERY  03/21/2012   Procedure: HEMORRHOIDECTOMY;  Surgeon: Leighton Ruff, MD;  Location: [facility];  Service: General;  Laterality: N/A;   KNEE SURGERY     LEFT KNEE SURGERY  AGE 83   MOUTH SURGERY     RECTAL BIOPSY  03/21/2012   Procedure:  BIOPSY RECTAL;  Surgeon: Leighton Ruff, MD;  Location: [facility];  Service: General;  Laterality: N/A;  WIDE LOCAL EXCISON OF ANAL MASS, POSSIBLE HEMORRHOIDECTOMY   TEETH EXTRACTIONS AND EXCISION MOUTH LESION  AGE 46   TRANSANAL EXCISION OF RECTAL MASS  04/07/2012   Procedure: TRANSANAL EXCISION OF RECTAL MASS;  Surgeon: Leighton Ruff, MD;  Location: [facility];  Service: General;  Laterality: N/A;  Wide Local Excision of Perianal Mass    Current Medications: No outpatient medications have been marked as taking for the 02/02/22 encounter (Appointment) with Charlie Pitter, PA-C.   ***   Allergies:   Rocephin [ceftriaxone sodium in dextrose] and Flomax [tamsulosin hcl]   Social History   Socioeconomic History   Marital status: Divorced    Spouse name: Not on file   Number of children: Not on file   Years of education: Not on file   Highest education level: Not on file  Occupational History   Not on file  Tobacco Use   Smoking status: Former    Types: Cigarettes   Smokeless tobacco: Current    Types: Snuff, Chew   Tobacco comments:    DIP AND CHEW TOBACCO FOR 40 YRS/ SMOKING UNTIL AGE 71 APPROX. >5 YRS  (PER PT DENIES SMOKING AT THIS TIME)  Substance and Sexual Activity   Alcohol use: Yes    Comment: Rare: 12 pack of beer in a year   Drug use: Yes    Types: Cocaine, Marijuana    Comment: Last cocaine 1 year ago, no marijuana in 2-3 years.   Sexual activity: Not on file  Other Topics Concern   Not on file  Social History Narrative   Not on file   Social Determinants of Health   Financial Resource Strain: Not on file  Food Insecurity: Not on file  Transportation Needs: Not on file  Physical Activity: Not on file  Stress: Not on file  Social Connections: Not on file     Family History:  The patient's ***family history includes Alcoholism in his brother and father; Cancer in his father; Colon cancer in his father.  ROS:   Please see the history of present  illness. Otherwise, review of systems is positive for ***.  All other systems are reviewed and otherwise negative.    EKG(s)/Additional Labs   EKG:  EKG is ordered today, personally reviewed, demonstrating ***  Recent Labs: No results found for requested labs within last 365 days.  Recent Lipid Panel    Component Value Date/Time   CHOL 209 (H) 03/18/2016 1019   TRIG 272 (H) 03/18/2016 1019   HDL 44 03/18/2016 1019   CHOLHDL 4.8 03/18/2016 1019   CHOLHDL 4.9 12/01/2015 0627   VLDL 21 12/01/2015 0627   LDLCALC 111 (H) 03/18/2016 1019    PHYSICAL EXAM:    VS:  There were no vitals taken for this visit.  BMI: There is no height or weight on file to  calculate BMI.  GEN: Well nourished, well developed male in no acute distress HEENT: normocephalic, atraumatic Neck: no JVD, carotid bruits, or masses Cardiac: ***RRR; no murmurs, rubs, or gallops, no edema  Respiratory:  clear to auscultation bilaterally, normal work of breathing GI: soft, nontender, nondistended, + BS MS: no deformity or atrophy Skin: warm and dry, no rash Neuro:  Alert and Oriented x 3, Strength and sensation are intact, follows commands Psych: euthymic mood, full affect  Wt Readings from Last 3 Encounters:  09/11/21 244 lb (110.7 kg)  09/13/20 249 lb (112.9 kg)  09/06/20 249 lb (112.9 kg)     ASSESSMENT & PLAN:   ***     Disposition: F/u with ***   Medication Adjustments/Labs and Tests Ordered: Current medicines are reviewed at length with the patient today.  Concerns regarding medicines are outlined above. Medication changes, Labs and Tests ordered today are summarized above and listed in the Patient Instructions accessible in Encounters.    Signed, Charlie Pitter, PA-C  02/01/2022 10:36 AM    [facility] Location in [place]. [place], [place] [address] Ph: [phone]; Fax [phone]

## 2022-02-02 ENCOUNTER — Ambulatory Visit: Payer: Medicaid Other | Admitting: Physician Assistant

## 2022-02-02 DIAGNOSIS — E785 Hyperlipidemia, unspecified: Secondary | ICD-10-CM

## 2022-02-02 DIAGNOSIS — I5189 Other ill-defined heart diseases: Secondary | ICD-10-CM

## 2022-02-02 DIAGNOSIS — I10 Essential (primary) hypertension: Secondary | ICD-10-CM

## 2022-02-02 DIAGNOSIS — R072 Precordial pain: Secondary | ICD-10-CM

## 2022-02-10 ENCOUNTER — Telehealth: Payer: Self-pay | Admitting: Orthopaedic Surgery

## 2022-02-10 NOTE — Telephone Encounter (Signed)
09/11/21 ov note faxed to [facility] [phone]

## 2022-05-15 ENCOUNTER — Ambulatory Visit: Payer: Medicaid Other | Admitting: Nurse Practitioner

## 2022-05-15 NOTE — Progress Notes (Deleted)
Cardiology Office Note:    Date:  05/15/2022   ID:  Larry Hamilton, DOB [birth year], MRN [id number]  PCP:  Olga Coaster, [place] [facility] Cardiologist:  Jenne Campus, MD { Click to update primary MD,subspecialty MD or APP then REFRESH:1}    Referring MD: Bucio, [place] Dragon, FNP   No chief complaint on file. ***  History of Present Illness:    Larry Hamilton is a 57 y.o. male with a hx of ***  History of chest pain History of dyspnea on exertion Mixed dyslipidemia Bipolar disorder, anxiety History of cocaine abuse Depression Hypertension History of schizophrenia, history of suicidal ideation  Patient is a 57 year old male with past medical history as mentioned above.  Patient reported exercise tolerance test about 15 to 20 years ago was normal.  He is a patient of Dr. Agustin Cree.  Last seen by him on Sep 06, 2020.  Was being evaluated for shortness of breath, abnormal EKG and chest pain at the request of [facility] in [facility].  Patient was found to be poor historian, however wife was reporting patient was getting easily short of breath.  Described having some uneasy sensation in chest at times.  Patient did note family history of coronary artery disease, was found not to be premature.  Denied any tobacco or alcohol use.  Was not exercising on regular basis and was not adhering to particular diet. Dr. Selinda Flavin arrange stress test that was negative.  Echocardiogram revealed normal EF, grade 1 DD, no RWMA.     SH: Truck driver  Past Medical History:  Diagnosis Date   Anxiety disorder CHEST PAIN   Arthritis    Basal cell carcinoma of anal skin    Bipolar 1 disorder ([place]) 11/29/2015   Bipolar disorder ([place])    [facility]   Cocaine abuse ([place])    Cocaine use disorder, moderate, dependence ([place]) 11/30/2015   Depression    Dysphagia, unspecified(787.20) 02/15/2013   Family history of colon cancer 02/15/2013   Hematochezia 11/29/2017    Hemorrhoids 11/29/2017   History of head injury AGE 89--  CLOSED HEAD INJURY W/ HEMATOMA AND POSSIBLE SKULL FX--  RESIDUAL HEADACHES   Hyperlipidemia    Hypertension    denies   Internal hemorrhoid    Kidney stones    Migraine    Nasal sinus congestion    Panic attacks    Preventative health care 11/29/2017   Primary basal cell carcinoma of anus 03/18/2012   Rectum pain    Schizophrenia ([place])    Suicidal ideation     Past Surgical History:  Procedure Laterality Date   APPENDECTOMY  AGE 26   COLONOSCOPY  2006   Dr. Rowe Pavy: normal colonoscopy.   COLONOSCOPY N/A 03/06/2013   Dr. Gala Romney- friable hemorrhoids;o/w negative colonoscopy   ESOPHAGOGASTRODUODENOSCOPY (EGD) WITH ESOPHAGEAL DILATION N/A 03/06/2013   Dr. Gala Romney- erosive/ulcerative reflux esophagitis. reactive gastopathy with mild chronic inflamation on bx.   HEMORRHOID SURGERY  03/21/2012   Procedure: HEMORRHOIDECTOMY;  Surgeon: Leighton Ruff, MD;  Location: [facility];  Service: General;  Laterality: N/A;   KNEE SURGERY     LEFT KNEE SURGERY  AGE 67   MOUTH SURGERY     RECTAL BIOPSY  03/21/2012   Procedure: BIOPSY RECTAL;  Surgeon: Leighton Ruff, MD;  Location: [facility];  Service: General;  Laterality: N/A;  WIDE LOCAL EXCISON OF ANAL MASS, POSSIBLE HEMORRHOIDECTOMY   TEETH EXTRACTIONS AND EXCISION MOUTH LESION  AGE 81   TRANSANAL EXCISION OF RECTAL MASS  04/07/2012   Procedure: TRANSANAL EXCISION OF RECTAL MASS;  Surgeon: Leighton Ruff, MD;  Location: [facility];  Service: General;  Laterality: N/A;  Wide Local Excision of Perianal Mass    Current Medications: No outpatient medications have been marked as taking for the 05/15/22 encounter (Appointment) with Finis Bud, NP.     Allergies:   Rocephin [ceftriaxone sodium in dextrose] and Flomax [tamsulosin hcl]   Social History   Socioeconomic History   Marital status: Divorced    Spouse name: Not on file   Number of children: Not on file    Years of education: Not on file   Highest education level: Not on file  Occupational History   Not on file  Tobacco Use   Smoking status: Former    Types: Cigarettes   Smokeless tobacco: Current    Types: Snuff, Chew   Tobacco comments:    DIP AND CHEW TOBACCO FOR 40 YRS/ SMOKING UNTIL AGE 32 APPROX. >5 YRS  (PER PT DENIES SMOKING AT THIS TIME)  Substance and Sexual Activity   Alcohol use: Yes    Comment: Rare: 12 pack of beer in a year   Drug use: Yes    Types: Cocaine, Marijuana    Comment: Last cocaine 1 year ago, no marijuana in 2-3 years.   Sexual activity: Not on file  Other Topics Concern   Not on file  Social History Narrative   Not on file   Social Determinants of Health   Financial Resource Strain: Not on file  Food Insecurity: Not on file  Transportation Needs: Not on file  Physical Activity: Not on file  Stress: Not on file  Social Connections: Not on file     Family History: The patient's ***family history includes Alcoholism in his brother and father; Cancer in his father; Colon cancer in his father.  ROS:   Please see the history of present illness.    *** All other systems reviewed and are negative.  EKGs/Labs/Other Studies Reviewed:    The following studies were reviewed today: ***  EKG:  EKG is *** ordered today.  The ekg ordered today demonstrates ***  Recent Labs: No results found for requested labs within last 365 days.  Recent Lipid Panel    Component Value Date/Time   CHOL 209 (H) 03/18/2016 1019   TRIG 272 (H) 03/18/2016 1019   HDL 44 03/18/2016 1019   CHOLHDL 4.8 03/18/2016 1019   CHOLHDL 4.9 12/01/2015 0627   VLDL 21 12/01/2015 0627   LDLCALC 111 (H) 03/18/2016 1019     Risk Assessment/Calculations:   {Does this patient have ATRIAL FIBRILLATION?:[phone]}  No BP recorded.  {Refresh Note OR Click here to enter BP  :1}***         Physical Exam:    VS:  There were no vitals taken for this visit.    Wt Readings from Last  3 Encounters:  09/11/21 244 lb (110.7 kg)  09/13/20 249 lb (112.9 kg)  09/06/20 249 lb (112.9 kg)     GEN: *** Well nourished, well developed in no acute distress HEENT: Normal NECK: No JVD; No carotid bruits LYMPHATICS: No lymphadenopathy CARDIAC: ***RRR, no murmurs, rubs, gallops RESPIRATORY:  Clear to auscultation without rales, wheezing or rhonchi  ABDOMEN: Soft, non-tender, non-distended MUSCULOSKELETAL:  No edema; No deformity  SKIN: Warm and dry NEUROLOGIC:  Alert and oriented x 3 PSYCHIATRIC:  Normal affect   ASSESSMENT:  No diagnosis found. PLAN:    In order of problems listed above:  ***      {Are you ordering a CV Procedure (e.g. stress test, cath, DCCV, TEE, etc)?   Press F2        :[id number]    Medication Adjustments/Labs and Tests Ordered: Current medicines are reviewed at length with the patient today.  Concerns regarding medicines are outlined above.  No orders of the defined types were placed in this encounter.  No orders of the defined types were placed in this encounter.   There are no Patient Instructions on file for this visit.   SignedFinis Bud, NP  05/15/2022 12:58 PM    [place]

## 2022-05-26 ENCOUNTER — Ambulatory Visit: Payer: Managed Care, Other (non HMO) | Attending: Nurse Practitioner | Admitting: Nurse Practitioner

## 2022-05-26 ENCOUNTER — Encounter: Payer: Self-pay | Admitting: Nurse Practitioner

## 2022-05-26 VITALS — BP 138/88 | HR 96 | Ht 68.0 in | Wt 244.8 lb

## 2022-05-26 DIAGNOSIS — Z720 Tobacco use: Secondary | ICD-10-CM | POA: Diagnosis not present

## 2022-05-26 DIAGNOSIS — I10 Essential (primary) hypertension: Secondary | ICD-10-CM | POA: Diagnosis not present

## 2022-05-26 DIAGNOSIS — E669 Obesity, unspecified: Secondary | ICD-10-CM

## 2022-05-26 DIAGNOSIS — E782 Mixed hyperlipidemia: Secondary | ICD-10-CM | POA: Diagnosis not present

## 2022-05-26 DIAGNOSIS — R0609 Other forms of dyspnea: Secondary | ICD-10-CM | POA: Diagnosis not present

## 2022-05-26 NOTE — Patient Instructions (Addendum)
Medication Instructions:  Your physician recommends that you continue on your current medications as directed. Please refer to the Current Medication list given to you today.  Labwork: None-send family doctor lab work to our office please  Testing/Procedures: none  Follow-Up: Your physician recommends that you schedule a follow-up appointment in: 6 months with Dr. Dellia Cloud  Any Other Special Instructions Will Be Listed Below (If Applicable). Your physician has requested that you regularly monitor and record your blood pressure readings at home. Please use the same machine at the same time of day to check your readings and record them. Call office if remain elevated. [place]   If you need a refill on your cardiac medications before your next appointment, please call your pharmacy.

## 2022-05-26 NOTE — Progress Notes (Unsigned)
Cardiology Office Note:    Date:  05/26/2022  ID:  Larry Hamilton, DOB May 24, 1965, MRN [id number]  PCP:  Larry Hamilton, [place] Prov[facility] Cardiologist:  Larry Campus, MD     Referring MD: Larry Coaster, FNP   CC: Here for follow-up  History of Present Illness:    Larry Hamilton is a 57 y.o. male with a hx of the following:  History of chest pain History of dyspnea on exertion Mixed dyslipidemia Bipolar disorder, anxiety History of substance abuse Depression Hypertension Schizophrenia  Patient is a very pleasant 57 year old male with past medical history as mentioned above.  Patient reported exercise tolerance test about 15 to 20 years ago was normal.  He is a patient of Dr. Agustin Hamilton.  Last seen by him on Sep 06, 2020.  Was being evaluated for shortness of breath, abnormal EKG and chest pain at the request of [facility] in [facility].  Patient was found to be poor historian, however wife was reporting patient was getting easily short of breath.  Described having some uneasy sensation in chest at times.  Patient did note family history of coronary artery disease, was found not to be premature. Denied any tobacco or alcohol use.  Was not exercising on regular basis and was not adhering to particular diet. Stress test  was arranged and was negative.  Echocardiogram revealed normal EF, grade 1 DD, no RWMA.  Today he presents for follow-up. Doing well. Denies any chest pain, shortness of breath, palpitations, syncope, presyncope, dizziness, orthopnea, PND, swelling or significant weight changes, acute bleeding, or claudication. He has quit smoking, but does admit to using snuff/dip. Denies any other questions or concerns.   SH: Works as a Administrator  Past Medical History:  Diagnosis Date   Anxiety disorder CHEST PAIN   Arthritis    Basal cell carcinoma of anal skin    Bipolar 1 disorder ([place]) 11/29/2015   Bipolar disorder ([place])    [facility]    Cocaine abuse ([place])    Cocaine use disorder, moderate, dependence ([place]) 11/30/2015   Depression    Dysphagia, unspecified(787.20) 02/15/2013   Family history of colon cancer 02/15/2013   Hematochezia 11/29/2017   Hemorrhoids 11/29/2017   History of head injury AGE 29--  CLOSED HEAD INJURY W/ HEMATOMA AND POSSIBLE SKULL FX--  RESIDUAL HEADACHES   Hyperlipidemia    Hypertension    denies   Internal hemorrhoid    Kidney stones    Migraine    Nasal sinus congestion    Panic attacks    Preventative health care 11/29/2017   Primary basal cell carcinoma of anus 03/18/2012   Rectum pain    Schizophrenia ([place])    Suicidal ideation     Past Surgical History:  Procedure Laterality Date   APPENDECTOMY  AGE 64   COLONOSCOPY  2006   Dr. Rowe Pavy: normal colonoscopy.   COLONOSCOPY N/A 03/06/2013   Dr. Gala Romney- friable hemorrhoids;o/w negative colonoscopy   ESOPHAGOGASTRODUODENOSCOPY (EGD) WITH ESOPHAGEAL DILATION N/A 03/06/2013   Dr. Gala Romney- erosive/ulcerative reflux esophagitis. reactive gastopathy with mild chronic inflamation on bx.   HEMORRHOID SURGERY  03/21/2012   Procedure: HEMORRHOIDECTOMY;  Surgeon: Leighton Ruff, MD;  Location: [facility];  Service: General;  Laterality: N/A;   KNEE SURGERY     LEFT KNEE SURGERY  AGE 71   MOUTH SURGERY     RECTAL BIOPSY  03/21/2012   Procedure: BIOPSY RECTAL;  Surgeon: Leighton Ruff, MD;  Location: [facility];  Service: General;  Laterality: N/A;  WIDE LOCAL EXCISON OF ANAL MASS, POSSIBLE HEMORRHOIDECTOMY   TEETH EXTRACTIONS AND EXCISION MOUTH LESION  AGE 38   TRANSANAL EXCISION OF RECTAL MASS  04/07/2012   Procedure: TRANSANAL EXCISION OF RECTAL MASS;  Surgeon: Leighton Ruff, MD;  Location: [facility];  Service: General;  Laterality: N/A;  Wide Local Excision of Perianal Mass    Current Medications: Current Meds  Medication Sig   ALPRAZolam (XANAX) 0.5 MG tablet Take 0.5 mg by mouth 2 (two) times daily.   atorvastatin  (LIPITOR) 20 MG tablet Take 1 tablet by mouth daily.   cyclobenzaprine (FLEXERIL) 10 MG tablet Take 10 mg by mouth as needed.   lisinopril (ZESTRIL) 10 MG tablet Take 1 tablet by mouth daily.   QUEtiapine (SEROQUEL) 300 MG tablet TAKE 1 TABLET BY MOUTH AT BEDTIME FOR  MOOD  CONTROL (Patient taking differently: Take 300 mg by mouth at bedtime. TAKE 1 TABLET BY MOUTH AT BEDTIME FOR  MOOD  CONTROL)     Allergies:   Rocephin [ceftriaxone sodium in dextrose] and Flomax [tamsulosin hcl]   Social History   Socioeconomic History   Marital status: Divorced    Spouse name: Not on file   Number of children: Not on file   Years of education: Not on file   Highest education level: Not on file  Occupational History   Not on file  Tobacco Use   Smoking status: Former    Types: Cigarettes   Smokeless tobacco: Current    Types: Snuff, Chew   Tobacco comments:    DIP AND CHEW TOBACCO FOR 40 YRS/ SMOKING UNTIL AGE 80 APPROX. >5 YRS  (PER PT DENIES SMOKING AT THIS TIME)  Substance and Sexual Activity   Alcohol use: Yes    Comment: Rare: 12 pack of beer in a year   Drug use: Yes    Types: Cocaine, Marijuana    Comment: Last cocaine 1 year ago, no marijuana in 2-3 years.   Sexual activity: Not on file  Other Topics Concern   Not on file  Social History Narrative   Not on file   Social Determinants of Health   Financial Resource Strain: Not on file  Food Insecurity: Not on file  Transportation Needs: Not on file  Physical Activity: Not on file  Stress: Not on file  Social Connections: Not on file     Family History: The patient's family history includes Alcoholism in his brother and father; Cancer in his father; Colon cancer in his father.  ROS:   Please see the history of present illness.     All other systems reviewed and are negative.  EKGs/Labs/Other Studies Reviewed:    The following studies were reviewed today:   EKG:  EKG is  ordered today.  The ekg ordered today  demonstrates NSR, 96 bpm, no acute ischemic changes.   Echocardiogram on 10/04/2020:  1. Left ventricular ejection fraction, by estimation, is 60 to 65%. The  left ventricle has normal function. The left ventricle has no regional  wall motion abnormalities. Left ventricular diastolic parameters are  consistent with Grade I diastolic  dysfunction (impaired relaxation).   2. Right ventricular systolic function is normal. The right ventricular  size is normal. There is normal pulmonary artery systolic pressure.   3. The mitral valve is normal in structure. No evidence of mitral valve  regurgitation. No evidence of mitral stenosis.   4. The aortic valve is  normal in structure. Aortic valve regurgitation is  not visualized. No aortic stenosis is present.   5. The inferior vena cava is normal in size with greater than 50%  respiratory variability, suggesting right atrial pressure of 3 mmHg.  Lexiscan on 09/13/2020:  Nuclear stress EF: 62%. The left ventricular ejection fraction is normal (55-65%). There was no ST segment deviation noted during stress. No T wave inversion was noted during stress. The study is normal. This is a low risk study.   1. Normal MPI study without ischemia or infarction.  2. Normal LVEF, 62%.  3. This is a low risk study.   Recent Labs: No results found for requested labs within last 365 days.  Recent Lipid Panel    Component Value Date/Time   CHOL 209 (H) 03/18/2016 1019   TRIG 272 (H) 03/18/2016 1019   HDL 44 03/18/2016 1019   CHOLHDL 4.8 03/18/2016 1019   CHOLHDL 4.9 12/01/2015 0627   VLDL 21 12/01/2015 0627   LDLCALC 111 (H) 03/18/2016 1019    Physical Exam:    VS:  BP 138/88   Pulse 96   Ht 5' 8"$  (1.727 m)   Wt 244 lb 12.8 oz (111 kg)   SpO2 96%   BMI 37.22 kg/m     Wt Readings from Last 3 Encounters:  05/26/22 244 lb 12.8 oz (111 kg)  09/11/21 244 lb (110.7 kg)  09/13/20 249 lb (112.9 kg)     GEN: Obese, 57 y.o. male in no acute  distress HEENT: Normal NECK: No JVD; No carotid bruits CARDIAC: S1/S2, RRR, no murmurs, rubs, gallops; 2+ pulses RESPIRATORY:  Clear to auscultation without rales, wheezing or rhonchi  MUSCULOSKELETAL:  No edema; No deformity  SKIN: Warm and dry NEUROLOGIC:  Alert and oriented x 3 PSYCHIATRIC:  Normal affect   ASSESSMENT:    1. Mixed dyslipidemia   2. DOE (dyspnea on exertion)   3. Snuff user   4. Hypertension, unspecified type   5. Obesity (BMI 30-39.9)    PLAN:    In order of problems listed above:  Mixed dyslipidemia He is due for labs. Currently defers labs at this time, and is requesting PCP to draw this. Will have his PCP fax [place] results. Continue atorvastatin. Heart healthy diet and regular cardiovascular exercise encouraged.   DOE Longstanding hx. Breathing has improved per his report. Last TTE in 2022 showed normal EF, grade 1 DD. No medication changes at this time. Continue to follow with PCP. Heart healthy diet and regular cardiovascular exercise encouraged.   Snuff user Uses 1 can of snuff/dip per day. Cessation encouraged and discussed.   4. HTN BP today 138/88. SBP at home averages 145. Discussed SBP goal < 130. Pt states BP is always elevated d/t hx of chronic pain. Given BP log and discussed to monitor BP at home at least 2 hours after medications and sitting for 5-10 minutes. Recommended Omron cuff. Continue lisinopril. Heart healthy diet and regular cardiovascular exercise encouraged.   5. Obesity BMI today 37.22. Weight loss via diet and exercise encouraged. Discussed the impact being overweight would have on cardiovascular risk. Heart healthy diet and regular cardiovascular exercise encouraged.   6. Disposition: Pt is requesting to be seen in [place] with Dr. Dellia Cloud in 6 months or sooner if anything changes.    Medication Adjustments/Labs and Tests Ordered: Current medicines are reviewed at length with the patient today.  Concerns  regarding medicines are outlined above.  Orders Placed This Encounter  Procedures   EKG 12-Lead   No orders of the defined types were placed in this encounter.   Patient Instructions  Medication Instructions:  Your physician recommends that you continue on your current medications as directed. Please refer to the Current Medication list given to you today.  Labwork: None-send family doctor lab work to our office please  Testing/Procedures: none  Follow-Up: Your physician recommends that you schedule a follow-up appointment in: 6 months with Dr. Dellia Cloud  Any Other Special Instructions Will Be Listed Below (If Applicable). Your physician has requested that you regularly monitor and record your blood pressure readings at home. Please use the same machine at the same time of day to check your readings and record them. Call office if remain elevated. [place]   If you need a refill on your cardiac medications before your next appointment, please call your pharmacy.   SignedFinis Bud, NP  05/27/2022 10:05 PM    [place]

## 2022-07-07 ENCOUNTER — Encounter: Payer: Self-pay | Admitting: *Deleted

## 2022-11-17 ENCOUNTER — Encounter: Payer: Self-pay | Admitting: *Deleted

## 2022-11-18 ENCOUNTER — Other Ambulatory Visit (HOSPITAL_COMMUNITY): Payer: Self-pay | Admitting: Family Medicine

## 2022-11-18 DIAGNOSIS — Z122 Encounter for screening for malignant neoplasm of respiratory organs: Secondary | ICD-10-CM

## 2022-12-08 ENCOUNTER — Encounter: Payer: Self-pay | Admitting: Internal Medicine

## 2022-12-08 ENCOUNTER — Ambulatory Visit: Payer: Medicaid Other | Attending: Internal Medicine | Admitting: Internal Medicine

## 2022-12-08 VITALS — BP 130/100 | HR 90 | Ht 68.0 in | Wt 258.4 lb

## 2022-12-08 DIAGNOSIS — R079 Chest pain, unspecified: Secondary | ICD-10-CM | POA: Insufficient documentation

## 2022-12-08 DIAGNOSIS — I10 Essential (primary) hypertension: Secondary | ICD-10-CM | POA: Diagnosis not present

## 2022-12-08 NOTE — Progress Notes (Signed)
Cardiology Office Note  Date: 12/08/2022   ID: COURAGE, RUBY Oct 05, 1965, MRN [id number]  PCP:  Shelby Dubin, FNP  Cardiologist:  Gypsy Balsam, MD Electrophysiologist:  None   History of Present Illness: Larry Hamilton is a 57 y.o. male known to have HTN is here for follow-up visit.   Overall doing great.  No interval DOE or angina.  Gained weight since 05/2022.  He said he lost his job and has been watching TV all day.  No exercise.  Eats healthy diet.  He bakes his food.  No dizziness, orthopnea, PND, syncope.  He has leg swelling which gets better with compression socks.  Past Medical History:  Diagnosis Date   Anxiety disorder CHEST PAIN   Arthritis    Basal cell carcinoma of anal skin    Bipolar 1 disorder (HCC) 11/29/2015   Bipolar disorder (HCC)    [facility]   Cocaine abuse (HCC)    Cocaine use disorder, moderate, dependence (HCC) 11/30/2015   Depression    Dysphagia, unspecified(787.20) 02/15/2013   Family history of colon cancer 02/15/2013   Hematochezia 11/29/2017   Hemorrhoids 11/29/2017   History of head injury AGE 59--  CLOSED HEAD INJURY W/ HEMATOMA AND POSSIBLE SKULL FX--  RESIDUAL HEADACHES   Hyperlipidemia    Hypertension    denies   Internal hemorrhoid    Kidney stones    Migraine    Nasal sinus congestion    Panic attacks    Preventative health care 11/29/2017   Primary basal cell carcinoma of anus 03/18/2012   Rectum pain    Schizophrenia (HCC)    Suicidal ideation     Past Surgical History:  Procedure Laterality Date   APPENDECTOMY  AGE 10   COLONOSCOPY  2006   Dr. Linna Darner: normal colonoscopy.   COLONOSCOPY N/A 03/06/2013   Dr. Jena Gauss- friable hemorrhoids;o/w negative colonoscopy   ESOPHAGOGASTRODUODENOSCOPY (EGD) WITH ESOPHAGEAL DILATION N/A 03/06/2013   Dr. Jena Gauss- erosive/ulcerative reflux esophagitis. reactive gastopathy with mild chronic inflamation on bx.   HEMORRHOID SURGERY  03/21/2012   Procedure: HEMORRHOIDECTOMY;  Surgeon:  Romie Levee, MD;  Location: [facility];  Service: General;  Laterality: N/A;   KNEE SURGERY     LEFT KNEE SURGERY  AGE 89   MOUTH SURGERY     RECTAL BIOPSY  03/21/2012   Procedure: BIOPSY RECTAL;  Surgeon: Romie Levee, MD;  Location: [facility];  Service: General;  Laterality: N/A;  WIDE LOCAL EXCISON OF ANAL MASS, POSSIBLE HEMORRHOIDECTOMY   TEETH EXTRACTIONS AND EXCISION MOUTH LESION  AGE 45   TRANSANAL EXCISION OF RECTAL MASS  04/07/2012   Procedure: TRANSANAL EXCISION OF RECTAL MASS;  Surgeon: Romie Levee, MD;  Location: [facility];  Service: General;  Laterality: N/A;  Wide Local Excision of Perianal Mass    Current Outpatient Medications  Medication Sig Dispense Refill   ALPRAZolam (XANAX) 0.5 MG tablet Take 0.5 mg by mouth 2 (two) times daily.     amoxicillin-clavulanate (AUGMENTIN) 875-125 MG tablet Take 1 tablet by mouth 2 (two) times daily.     atorvastatin (LIPITOR) 20 MG tablet Take 1 tablet by mouth daily.     cyclobenzaprine (FLEXERIL) 10 MG tablet Take 10 mg by mouth as needed.     ibuprofen (ADVIL) 800 MG tablet Take 800 mg by mouth 3 (three) times daily.     lisinopril (ZESTRIL) 10 MG tablet Take 1 tablet by mouth daily.     QUEtiapine (  SEROQUEL) 300 MG tablet TAKE 1 TABLET BY MOUTH AT BEDTIME FOR  MOOD  CONTROL (Patient taking differently: Take 300 mg by mouth at bedtime. TAKE 1 TABLET BY MOUTH AT BEDTIME FOR  MOOD  CONTROL) 90 tablet 0   No current facility-administered medications for this visit.   Allergies:  Rocephin [ceftriaxone sodium in dextrose] and Flomax [tamsulosin hcl]   Social History: The patient  reports that he has quit smoking. His smoking use included cigarettes. His smokeless tobacco use includes snuff and chew. He reports current alcohol use. He reports current drug use. Drugs: Cocaine and Marijuana.   Family History: The patient's family history includes Alcoholism in his brother and father; Cancer in his father; Colon  cancer in his father.   ROS:  Please see the history of present illness. Otherwise, complete review of systems is positive for none  All other systems are reviewed and negative.   Physical Exam: VS:  BP (!) 130/100   Pulse 90   Ht 5\' 8"  (1.727 m)   Wt 258 lb 6.4 oz (117.2 kg)   SpO2 93%   BMI 39.29 kg/m , BMI Body mass index is 39.29 kg/m.  Wt Readings from Last 3 Encounters:  12/08/22 258 lb 6.4 oz (117.2 kg)  05/26/22 244 lb 12.8 oz (111 kg)  09/11/21 244 lb (110.7 kg)    General: Patient appears comfortable at rest. HEENT: Conjunctiva and lids normal, oropharynx clear with moist mucosa. Neck: Supple, no elevated JVP or carotid bruits, no thyromegaly. Lungs: Clear to auscultation, nonlabored breathing at rest. Cardiac: Regular rate and rhythm, no S3 or significant systolic murmur, no pericardial rub. Abdomen: Soft, nontender, no hepatomegaly, bowel sounds present, no guarding or rebound. Extremities: No pitting edema, distal pulses 2+. Skin: Warm and dry. Musculoskeletal: No kyphosis. Neuropsychiatric: Alert and oriented x3, affect grossly appropriate.  Recent Labwork: No results found for requested labs within last 365 days.     Component Value Date/Time   CHOL 209 (H) 03/18/2016 1019   TRIG 272 (H) 03/18/2016 1019   HDL 44 03/18/2016 1019   CHOLHDL 4.8 03/18/2016 1019   CHOLHDL 4.9 12/01/2015 0627   VLDL 21 12/01/2015 0627   LDLCALC 111 (H) 03/18/2016 1019    Other Studies Reviewed Today:   Assessment and Plan:   DOE and chest pain, resolved Normal echo and negative stress test HTN, controlled HLD, unknown values OSA not on CPAP, awaiting   -Patient was referred to cardiology clinic in 2022 for chest pain and DOE, poor historian.  In 2022, echo showed normal LVEF, G1 DD and no valvular heart disease.  NM stress test showed no evidence of ischemia.  Patient denies any interval DOE or angina.  He lost his job and watches TV all day.  Does not exercise.  Not  interested.  Eats healthy diet.  Can follow-up with PCP for further management. -Continue current antihypertensive, lisinopril 10 mg once daily, follows with PCP. -Continue atorvastatin 20 mg nightly, follows with PCP.      Medication Adjustments/Labs and Tests Ordered: Current medicines are reviewed at length with the patient today.  Concerns regarding medicines are outlined above.    Disposition:  Follow up prn  Signed Jilene Spohr Verne Spurr, MD, 12/08/2022 12:55 PM    [facility] [address] [place], [place], [place] [address]

## 2022-12-08 NOTE — Patient Instructions (Signed)

## 2022-12-18 ENCOUNTER — Encounter: Payer: Self-pay | Admitting: *Deleted

## 2022-12-31 ENCOUNTER — Ambulatory Visit (HOSPITAL_COMMUNITY): Payer: Medicaid Other

## 2022-12-31 ENCOUNTER — Encounter (HOSPITAL_COMMUNITY): Payer: Self-pay

## 2023-02-11 ENCOUNTER — Emergency Department (HOSPITAL_COMMUNITY): Payer: Medicaid Other

## 2023-02-11 ENCOUNTER — Emergency Department (HOSPITAL_COMMUNITY)
Admission: EM | Admit: 2023-02-11 | Discharge: 2023-02-11 | Disposition: A | Discharge status: Home / Self Care | Payer: Medicaid Other | Attending: Emergency Medicine | Admitting: Emergency Medicine

## 2023-02-11 ENCOUNTER — Other Ambulatory Visit: Payer: Self-pay

## 2023-02-11 ENCOUNTER — Encounter (HOSPITAL_COMMUNITY): Payer: Self-pay

## 2023-02-11 DIAGNOSIS — Z8659 Personal history of other mental and behavioral disorders: Secondary | ICD-10-CM | POA: Diagnosis not present

## 2023-02-11 DIAGNOSIS — Z87891 Personal history of nicotine dependence: Secondary | ICD-10-CM | POA: Diagnosis not present

## 2023-02-11 DIAGNOSIS — M545 Low back pain, unspecified: Secondary | ICD-10-CM | POA: Insufficient documentation

## 2023-02-11 LAB — URINALYSIS, ROUTINE W REFLEX MICROSCOPIC
Bacteria, UA: NONE SEEN
Bilirubin Urine: NEGATIVE
Glucose, UA: NEGATIVE mg/dL
Hgb urine dipstick: NEGATIVE
Ketones, ur: NEGATIVE mg/dL
Leukocytes,Ua: NEGATIVE
Nitrite: NEGATIVE
Protein, ur: 30 mg/dL — AB
Specific Gravity, Urine: 1.033 — ABNORMAL HIGH (ref 1.005–1.030)
pH: 5 (ref 5.0–8.0)

## 2023-02-11 LAB — BASIC METABOLIC PANEL
Anion gap: 13 (ref 5–15)
BUN: 17 mg/dL (ref 6–20)
CO2: 19 mmol/L — ABNORMAL LOW (ref 22–32)
Calcium: 9.3 mg/dL (ref 8.9–10.3)
Chloride: 105 mmol/L (ref 98–111)
Creatinine, Ser: 0.79 mg/dL (ref 0.61–1.24)
GFR, Estimated: >60 mL/min (ref >60)
Glucose, Bld: 124 mg/dL — ABNORMAL HIGH (ref 70–99)
Potassium: 4.1 mmol/L (ref 3.5–5.1)
Sodium: 137 mmol/L (ref 135–145)

## 2023-02-11 LAB — CBC WITH DIFFERENTIAL/PLATELET
Abs Immature Granulocytes: 0.04 10*3/uL (ref 0.00–0.07)
Basophils Absolute: 0.1 10*3/uL (ref 0.0–0.1)
Basophils Relative: 1 %
Eosinophils Absolute: 0.3 10*3/uL (ref 0.0–0.5)
Eosinophils Relative: 3 %
HCT: 47 % (ref 39.0–52.0)
Hemoglobin: 16.4 g/dL (ref 13.0–17.0)
Immature Granulocytes: 0 %
Lymphocytes Relative: 33 %
Lymphs Abs: 3 10*3/uL (ref 0.7–4.0)
MCH: 30.1 pg (ref 26.0–34.0)
MCHC: 34.9 g/dL (ref 30.0–36.0)
MCV: 86.4 fL (ref 80.0–100.0)
Monocytes Absolute: 0.7 10*3/uL (ref 0.1–1.0)
Monocytes Relative: 7 %
Neutro Abs: 5 10*3/uL (ref 1.7–7.7)
Neutrophils Relative %: 56 %
Platelets: 269 10*3/uL (ref 150–400)
RBC: 5.44 MIL/uL (ref 4.22–5.81)
RDW: 12.7 % (ref 11.5–15.5)
WBC: 9.1 10*3/uL (ref 4.0–10.5)
nRBC: 0 % (ref 0.0–0.2)

## 2023-02-11 LAB — TROPONIN I (HIGH SENSITIVITY)
Troponin I (High Sensitivity): 4 ng/L (ref <18)
Troponin I (High Sensitivity): 3 ng/L (ref <18)

## 2023-02-11 MED ORDER — LIDOCAINE 5 % EX PTCH: 1.0000 | MEDICATED_PATCH | CUTANEOUS | 0 refills | Status: DC

## 2023-02-11 MED ORDER — ACETAMINOPHEN 325 MG PO TABS
650.0000 mg | ORAL_TABLET | Freq: Once | ORAL | Status: AC
  Administered 2023-02-11: 650 mg via ORAL
  Filled 2023-02-11: qty 2

## 2023-02-11 MED ORDER — KETOROLAC TROMETHAMINE 15 MG/ML IJ SOLN
15.0000 mg | Freq: Once | INTRAMUSCULAR | Status: AC
  Administered 2023-02-11: 15 mg via INTRAMUSCULAR
  Filled 2023-02-11: qty 1

## 2023-02-11 MED ORDER — LIDOCAINE 5 % EX PTCH: 1.0000 | MEDICATED_PATCH | CUTANEOUS | 0 refills | Status: AC

## 2023-02-11 NOTE — ED Triage Notes (Signed)
Pt is complaining of pain in the middle of his back, states the pain is worse on the right side. States the pain is worse at night when he is laying down. Pain has been going on about a week.

## 2023-02-11 NOTE — ED Provider Notes (Signed)
[place] EMERGENCY DEPARTMENT AT [facility] Provider Note   CSN: [id number] Arrival date & time: 02/11/23  0845     History Chief Complaint  Patient presents with   Back Pain    Larry Hamilton is a 57 y.o. male history of cocaine use, hyperlipidemia, and tobacco use who presents to the emergency department with lower mid back pain.  Patient states that he does suffer from kidney stones but states this pain feels different.  Pain is localized to the lower middle back with radiation across the back in that area.  He denies any urinary complaints including dysuria, hematuria, urinary frequency, dysuria.  Patient also complaining of intermittent chest discomfort.  Denies any nausea, vomiting, diarrhea, fever, chills.   Back Pain      Home Medications Prior to Admission medications   Medication Sig Start Date End Date Taking? Authorizing Provider  lidocaine (LIDODERM) 5 % Place 1 patch onto the skin daily. Remove & Discard patch within 12 hours or as directed by MD 02/11/23  Yes Meredeth Ide, Avari Gelles M, PA-C  ALPRAZolam Prudy Feeler) 0.5 MG tablet Take 0.5 mg by mouth 2 (two) times daily. 07/13/20   [provider]  amoxicillin-clavulanate (AUGMENTIN) 875-125 MG tablet Take 1 tablet by mouth 2 (two) times daily. 12/02/22   [provider]  atorvastatin (LIPITOR) 20 MG tablet Take 1 tablet by mouth daily. 07/25/20   [provider]  cyclobenzaprine (FLEXERIL) 10 MG tablet Take 10 mg by mouth as needed. 12/18/20   [provider]  ibuprofen (ADVIL) 800 MG tablet Take 800 mg by mouth 3 (three) times daily. 12/18/20   [provider]  lisinopril (ZESTRIL) 10 MG tablet Take 1 tablet by mouth daily. 07/24/20   [provider]  QUEtiapine (SEROQUEL) 300 MG tablet TAKE 1 TABLET BY MOUTH AT BEDTIME FOR  MOOD  CONTROL Patient taking differently: Take 300 mg by mouth at bedtime. TAKE 1 TABLET BY MOUTH AT BEDTIME FOR  MOOD  CONTROL 03/21/18   Johna Sheriff, MD      Allergies    Rocephin [ceftriaxone sodium in dextrose] and Flomax [tamsulosin hcl]    Review of Systems   Review of Systems  Musculoskeletal:  Positive for back pain.  All other systems reviewed and are negative.   Physical Exam Updated Vital Signs BP (!) 134/98   Pulse 82   Temp (!) 97.5 F (36.4 C) (Oral)   Resp 20   Ht 5\' 8"  (1.727 m)   Wt 117.9 kg   SpO2 96%   BMI 39.53 kg/m  Physical Exam Vitals and nursing note reviewed.  Constitutional:      General: He is not in acute distress.    Appearance: Normal appearance.  HENT:     Head: Normocephalic and atraumatic.  Eyes:     General:        Right eye: No discharge.        Left eye: No discharge.  Cardiovascular:     Comments: Regular rate and rhythm.  S1/S2 are distinct without any evidence of murmur, rubs, or gallops.  Radial pulses are 2+ bilaterally.  Dorsalis pedis pulses are 2+ bilaterally.  No evidence of pedal edema. Pulmonary:     Comments: Clear to auscultation bilaterally.  Normal effort.  No respiratory distress.  No evidence of wheezes, rales, or rhonchi heard throughout. Abdominal:     General: Abdomen is flat. Bowel sounds are normal. There is no distension.  Tenderness: There is no abdominal tenderness. There is no guarding or rebound.  Musculoskeletal:        General: Normal range of motion.     Cervical back: Neck supple.     Comments: Mild upper lumbar midline tenderness with paralumbar muscular tenderness.  There is palpable spasm on the right in comparison to the left.  5/5 strength to the upper and lower extremities.  Skin:    General: Skin is warm and dry.     Findings: No rash.  Neurological:     General: No focal deficit present.     Mental Status: He is alert.  Psychiatric:        Mood and Affect: Mood normal.        Behavior: Behavior normal.     ED Results / Procedures / Treatments   Labs (all labs ordered are listed, but only abnormal results are  displayed) Labs Reviewed  BASIC METABOLIC PANEL - Abnormal; Notable for the following components:      Result Value   CO2 19 (*)    Glucose, Bld 124 (*)    All other components within normal limits  URINALYSIS, ROUTINE W REFLEX MICROSCOPIC - Abnormal; Notable for the following components:   Specific Gravity, Urine 1.033 (*)    Protein, ur 30 (*)    All other components within normal limits  CBC WITH DIFFERENTIAL/PLATELET  TROPONIN I (HIGH SENSITIVITY)  TROPONIN I (HIGH SENSITIVITY)    EKG None  Radiology DG Lumbar Spine Complete  Result Date: 02/11/2023 CLINICAL DATA:  Lower back pain. EXAM: LUMBAR SPINE - COMPLETE 4+ VIEW COMPARISON:  06/18/2006. FINDINGS: There are 5 nonrib-bearing lumbar vertebrae. There is loss of lumbar lordosis, which may be on the basis of positioning or due to muscle spasm. No spondylolysis or spondylolisthesis. Vertebral body heights are maintained. No aggressive osseous lesion. Intervertebral disc heights are maintained. Minimal multilevel facet arthropathy and marginal osteophyte formation noted. Sacroiliac joints are symmetric. Visualized soft tissues are within normal limits. IMPRESSION: *No acute osseous abnormality of the lumbar spine. Minimal multilevel degenerative changes. Electronically Signed   By: Jules Schick M.D.   On: 02/11/2023 11:27   DG Chest 2 View  Result Date: 02/11/2023 CLINICAL DATA:  Chest pain. EXAM: CHEST - 2 VIEW COMPARISON:  11/11/2022. FINDINGS: Linear area of atelectasis noted overlying the right lung base. Bilateral lung fields are otherwise clear. No acute consolidation or lung collapse. Bilateral costophrenic angles are clear. Normal cardio-mediastinal silhouette. No acute osseous abnormalities. The soft tissues are within normal limits. IMPRESSION: *No active cardiopulmonary disease. Electronically Signed   By: Jules Schick M.D.   On: 02/11/2023 11:26    Procedures Procedures    Medications Ordered in ED Medications   ketorolac (TORADOL) 15 MG/ML injection 15 mg (15 mg Intramuscular Given 02/11/23 1004)  acetaminophen (TYLENOL) tablet 650 mg (650 mg Oral Given 02/11/23 1408)    ED Course/ Medical Decision Making/ A&P Clinical Course as of 02/11/23 1417  Thu Feb 11, 2023  1109 CBC with Differential Normal. [CF]  1109 Troponin I (High Sensitivity) Initial troponin is normal.  [CF]  1109 Basic metabolic panel(!) Normal.  Elevated glucose in the setting of a nonfasting sample. [CF]  1356 Troponin I (High Sensitivity) Delta trop is normal. Low suspicion for ASC. [CF]  1356 Urinalysis, Routine w reflex microscopic -Urine, Clean Catch(!) Normal.  [CF]    Clinical Course User Index [CF] Teressa Lower, PA-C   {   Click here for ABCD2, HEART  and other calculators  Medical Decision Making KAREEM CATHEY Hamilton is a 57 y.o. male patient who presents to the emergency department today for further evaluation of midline back pain.  This was nontraumatic.  There was no injury to the back.  I will plan on getting a basic lumbar film to further assess for any anatomical pathology.  I have a low suspicion for epidural abscess or cauda equina syndrome at this time.  This seems to be more consistent with muscular spasm for muscle strain.  I will also get some cardiac enzymes to further assess his chest discomfort.  Patient does have some risk factors.  Overall, patient is in no acute distress at this time.  Imaging did not reveal any anatomical pathology.  Will treat for muscular strain versus muscular spasm conservatively with NSAIDs and lidocaine patches.  Patient agreeable with plan.  He will follow-up with his primary care doctor.  I have a low suspicion for kidney stone at this time.  He is safe for discharge.  Amount and/or Complexity of Data Reviewed Labs: ordered. Decision-making details documented in ED Course. Radiology: ordered.  Risk OTC drugs. Prescription drug management.    Final Clinical  Impression(s) / ED Diagnoses Final diagnoses:  Acute bilateral low back pain without sciatica    Rx / DC Orders ED Discharge Orders          Ordered    lidocaine (LIDODERM) 5 %  Every 24 hours        02/11/23 1404              Honor Loh [place], [place] 02/11/23 1417    Franne Forts, DO 02/12/23 2256

## 2023-02-11 NOTE — Discharge Instructions (Signed)
As we discussed, you can continue taking Aleve and stack on Tylenol at the 3 to 4-hour mark.  Please not take these medications every single day for longer than 5 days without taking a break.  You can also use the lidocaine patches that I have prescribed today.  Please follow-up with your primary care doctor for further evaluation.  You may return to the emergency department for any worsening symptoms.

## 2023-04-28 ENCOUNTER — Encounter (INDEPENDENT_AMBULATORY_CARE_PROVIDER_SITE_OTHER): Payer: Self-pay | Admitting: *Deleted

## 2023-05-19 ENCOUNTER — Ambulatory Visit (HOSPITAL_COMMUNITY)
Admission: RE | Admit: 2023-05-19 | Discharge: 2023-05-19 | Disposition: A | Discharge status: Home / Self Care | Payer: Medicaid Other | Source: Ambulatory Visit | Attending: Family Medicine | Admitting: Family Medicine

## 2023-05-19 DIAGNOSIS — Z122 Encounter for screening for malignant neoplasm of respiratory organs: Secondary | ICD-10-CM | POA: Diagnosis present

## 2023-05-20 ENCOUNTER — Encounter (INDEPENDENT_AMBULATORY_CARE_PROVIDER_SITE_OTHER): Payer: Self-pay | Admitting: *Deleted

## 2023-07-26 ENCOUNTER — Emergency Department (HOSPITAL_COMMUNITY)
Admission: EM | Admit: 2023-07-26 | Discharge: 2023-07-26 | Disposition: A | Discharge status: Home / Self Care | Attending: Emergency Medicine | Admitting: Emergency Medicine

## 2023-07-26 ENCOUNTER — Encounter (HOSPITAL_COMMUNITY): Payer: Self-pay

## 2023-07-26 ENCOUNTER — Emergency Department (HOSPITAL_COMMUNITY)

## 2023-07-26 ENCOUNTER — Other Ambulatory Visit: Payer: Self-pay

## 2023-07-26 DIAGNOSIS — Z79899 Other long term (current) drug therapy: Secondary | ICD-10-CM | POA: Diagnosis not present

## 2023-07-26 DIAGNOSIS — I10 Essential (primary) hypertension: Secondary | ICD-10-CM | POA: Diagnosis not present

## 2023-07-26 DIAGNOSIS — R0789 Other chest pain: Secondary | ICD-10-CM | POA: Insufficient documentation

## 2023-07-26 LAB — BASIC METABOLIC PANEL WITH GFR
Anion gap: 12 (ref 5–15)
BUN: 19 mg/dL (ref 6–20)
CO2: 21 mmol/L — ABNORMAL LOW (ref 22–32)
Calcium: 9.4 mg/dL (ref 8.9–10.3)
Chloride: 102 mmol/L (ref 98–111)
Creatinine, Ser: 0.77 mg/dL (ref 0.61–1.24)
GFR, Estimated: >60 mL/min (ref >60)
Glucose, Bld: 147 mg/dL — ABNORMAL HIGH (ref 70–99)
Potassium: 3.9 mmol/L (ref 3.5–5.1)
Sodium: 135 mmol/L (ref 135–145)

## 2023-07-26 LAB — CBC
HCT: 47.5 % (ref 39.0–52.0)
Hemoglobin: 16.6 g/dL (ref 13.0–17.0)
MCH: 29.4 pg (ref 26.0–34.0)
MCHC: 34.9 g/dL (ref 30.0–36.0)
MCV: 84.2 fL (ref 80.0–100.0)
Platelets: 202 10*3/uL (ref 150–400)
RBC: 5.64 MIL/uL (ref 4.22–5.81)
RDW: 13.3 % (ref 11.5–15.5)
WBC: 6.6 10*3/uL (ref 4.0–10.5)
nRBC: 0 % (ref 0.0–0.2)

## 2023-07-26 LAB — TROPONIN I (HIGH SENSITIVITY)
Troponin I (High Sensitivity): 8 ng/L (ref <18)
Troponin I (High Sensitivity): 9 ng/L (ref <18)

## 2023-07-26 NOTE — ED Notes (Signed)
 Pt taken to xray

## 2023-07-26 NOTE — ED Provider Notes (Signed)
 [place] EMERGENCY DEPARTMENT AT [facility] Provider Note   CSN: [id number] Arrival date & time: 07/26/23  2012     History  Chief Complaint  Patient presents with   Chest Pain    Larry Hamilton is a 58 y.o. male.  Patient is a 58 year old male with past medical history of hypertension, hyperlipidemia, bipolar disorder.  Patient presenting today for evaluation of chest and back pain.  This has been ongoing for the past week.  He reports falling down a flight of stairs and has been sore and uncomfortable since.  He denies to me any shortness of breath, nausea, or diaphoresis, but does report what he calls "night sweats".  No exertional symptoms.  Patient has no prior cardiac history.  He did have a stress test in the past 2 years that was unremarkable.       Home Medications Prior to Admission medications   Medication Sig Start Date End Date Taking? Authorizing Provider  ALPRAZolam Prudy Feeler) 0.5 MG tablet Take 0.5 mg by mouth 2 (two) times daily. 07/13/20   [provider]  amoxicillin-clavulanate (AUGMENTIN) 875-125 MG tablet Take 1 tablet by mouth 2 (two) times daily. 12/02/22   [provider]  atorvastatin (LIPITOR) 20 MG tablet Take 1 tablet by mouth daily. 07/25/20   [provider]  cyclobenzaprine (FLEXERIL) 10 MG tablet Take 10 mg by mouth as needed. 12/18/20   [provider]  ibuprofen (ADVIL) 800 MG tablet Take 800 mg by mouth 3 (three) times daily. 12/18/20   [provider]  lidocaine (LIDODERM) 5 % Place 1 patch onto the skin daily. Remove & Discard patch within 12 hours or as directed by MD 02/11/23   Teressa Lower, PA-C  lisinopril (ZESTRIL) 10 MG tablet Take 1 tablet by mouth daily. 07/24/20   [provider]  QUEtiapine (SEROQUEL) 300 MG tablet TAKE 1 TABLET BY MOUTH AT BEDTIME FOR  MOOD  CONTROL Patient taking differently: Take 300 mg by mouth at bedtime. TAKE 1 TABLET BY MOUTH AT BEDTIME FOR  MOOD   CONTROL 03/21/18   Johna Sheriff, MD      Allergies    Rocephin [ceftriaxone sodium in dextrose] and Flomax [tamsulosin hcl]    Review of Systems   Review of Systems  All other systems reviewed and are negative.   Physical Exam Updated Vital Signs BP (!) 163/95 (BP Location: Right Arm)   Pulse (!) 108   Temp 97.6 F (36.4 C) (Oral)   Resp 20   Ht 5\' 8"  (1.727 m)   Wt 113.4 kg   SpO2 97%   BMI 38.01 kg/m  Physical Exam Vitals and nursing note reviewed.  Constitutional:      General: He is not in acute distress.    Appearance: He is well-developed. He is not diaphoretic.  HENT:     Head: Normocephalic and atraumatic.  Cardiovascular:     Rate and Rhythm: Normal rate and regular rhythm.     Heart sounds: No murmur heard.    No friction rub.     Comments: There is tenderness to palpation over the anterior chest wall.  This reproduces his symptoms. Pulmonary:     Effort: Pulmonary effort is normal. No respiratory distress.     Breath sounds: Normal breath sounds. No wheezing or rales.  Abdominal:     General: Bowel sounds are normal. There is no distension.     Palpations: Abdomen is soft.  Tenderness: There is no abdominal tenderness.  Musculoskeletal:        General: Normal range of motion.     Cervical back: Normal range of motion and neck supple.  Skin:    General: Skin is warm and dry.  Neurological:     Mental Status: He is alert and oriented to person, place, and time.     Coordination: Coordination normal.     ED Results / Procedures / Treatments   Labs (all labs ordered are listed, but only abnormal results are displayed) Labs Reviewed  BASIC METABOLIC PANEL WITH GFR - Abnormal; Notable for the following components:      Result Value   CO2 21 (*)    Glucose, Bld 147 (*)    All other components within normal limits  CBC  TROPONIN I (HIGH SENSITIVITY)  TROPONIN I (HIGH SENSITIVITY)    EKG EKG Interpretation Date/Time:  Monday July 26 2023  20:21:54 EDT Ventricular Rate:  107 PR Interval:  142 QRS Duration:  78 QT Interval:  346 QTC Calculation: 461 R Axis:   60  Text Interpretation: Sinus tachycardia Cannot rule out Anterior infarct , age undetermined Abnormal ECG No significant change since 02/11/2023 Confirmed by Orvilla Blander ([id number]) on 07/26/2023 11:11:00 PM  Radiology DG Chest 2 View Result Date: 07/26/2023 CLINICAL DATA:  Status post fall with subsequent chest pain. EXAM: CHEST - 2 VIEW COMPARISON:  February 11, 2023 FINDINGS: The heart size and mediastinal contours are within normal limits. Low lung volumes are noted. Mild linear scarring and/or atelectatic changes are seen within the bilateral lung bases. There is no evidence of focal consolidation, pleural effusion or pneumothorax. The visualized skeletal structures are unremarkable. IMPRESSION: Low lung volumes with mild bibasilar linear scarring and/or atelectatic changes. Electronically Signed   By: Virgle Grime M.D.   On: 07/26/2023 21:33    Procedures Procedures    Medications Ordered in ED Medications - No data to display  ED Course/ Medical Decision Making/ A&P  Patient is a 58 year old male presenting with chest pain and back pain after a fall that occurred 1 week ago.  Patient arrives here with stable vital signs and is afebrile.  Physical examination reveals reproducible tenderness over the anterior chest wall, but is otherwise unremarkable.  Laboratory studies obtained including CBC, metabolic panel, and troponin x 2, all of which are unremarkable.  Chest x-ray obtained showing no acute process.  I see nothing that indicates an emergent condition.  Workup is unremarkable and discomfort is reproducible and most consistent with a musculoskeletal etiology.  Patient to be discharged with rest, nsaids, and follow up as needed.  Final Clinical Impression(s) / ED Diagnoses Final diagnoses:  None    Rx / DC Orders ED Discharge Orders     None          Orvilla Blander, MD 07/26/23 2335

## 2023-07-26 NOTE — ED Triage Notes (Signed)
 Pt reports chest pain, abd pain, shoulder pain, and back pain that started last week after falling.

## 2023-07-26 NOTE — Discharge Instructions (Signed)
Begin taking ibuprofen 600 mg every 6 hours as needed for pain.  Follow-up with primary doctor if not improving in the next week, and return to the ER if symptoms significantly worsen or change.

## 2023-10-27 ENCOUNTER — Encounter (INDEPENDENT_AMBULATORY_CARE_PROVIDER_SITE_OTHER): Payer: Self-pay | Admitting: *Deleted

## 2024-03-10 ENCOUNTER — Emergency Department (HOSPITAL_COMMUNITY)

## 2024-03-10 ENCOUNTER — Encounter (HOSPITAL_COMMUNITY): Payer: Self-pay | Admitting: *Deleted

## 2024-03-10 ENCOUNTER — Other Ambulatory Visit: Payer: Self-pay

## 2024-03-10 ENCOUNTER — Emergency Department (HOSPITAL_COMMUNITY)
Admission: EM | Admit: 2024-03-10 | Discharge: 2024-03-10 | Disposition: A | Discharge status: Home / Self Care | Attending: Emergency Medicine | Admitting: Emergency Medicine

## 2024-03-10 DIAGNOSIS — Z79899 Other long term (current) drug therapy: Secondary | ICD-10-CM | POA: Diagnosis not present

## 2024-03-10 DIAGNOSIS — R509 Fever, unspecified: Secondary | ICD-10-CM | POA: Diagnosis present

## 2024-03-10 DIAGNOSIS — I10 Essential (primary) hypertension: Secondary | ICD-10-CM | POA: Diagnosis not present

## 2024-03-10 DIAGNOSIS — B349 Viral infection, unspecified: Secondary | ICD-10-CM | POA: Insufficient documentation

## 2024-03-10 LAB — CBC WITH DIFFERENTIAL/PLATELET
Abs Immature Granulocytes: 0.02 K/uL (ref 0.00–0.07)
Basophils Absolute: 0.1 K/uL (ref 0.0–0.1)
Basophils Relative: 1 %
Eosinophils Absolute: 0.2 K/uL (ref 0.0–0.5)
Eosinophils Relative: 2 %
HCT: 45.5 % (ref 39.0–52.0)
Hemoglobin: 15.3 g/dL (ref 13.0–17.0)
Immature Granulocytes: 0 %
Lymphocytes Relative: 34 %
Lymphs Abs: 2.7 K/uL (ref 0.7–4.0)
MCH: 30 pg (ref 26.0–34.0)
MCHC: 33.6 g/dL (ref 30.0–36.0)
MCV: 89.2 fL (ref 80.0–100.0)
Monocytes Absolute: 0.6 K/uL (ref 0.1–1.0)
Monocytes Relative: 7 %
Neutro Abs: 4.5 K/uL (ref 1.7–7.7)
Neutrophils Relative %: 56 %
Platelets: 254 K/uL (ref 150–400)
RBC: 5.1 MIL/uL (ref 4.22–5.81)
RDW: 12.3 % (ref 11.5–15.5)
WBC: 8.1 K/uL (ref 4.0–10.5)
nRBC: 0 % (ref 0.0–0.2)

## 2024-03-10 LAB — COMPREHENSIVE METABOLIC PANEL WITH GFR
ALT: 18 U/L (ref 0–44)
AST: 15 U/L (ref 15–41)
Albumin: 4.2 g/dL (ref 3.5–5.0)
Alkaline Phosphatase: 66 U/L (ref 38–126)
Anion gap: 10 (ref 5–15)
BUN: 15 mg/dL (ref 6–20)
CO2: 24 mmol/L (ref 22–32)
Calcium: 9.3 mg/dL (ref 8.9–10.3)
Chloride: 106 mmol/L (ref 98–111)
Creatinine, Ser: 0.77 mg/dL (ref 0.61–1.24)
GFR, Estimated: >60 mL/min (ref >60)
Glucose, Bld: 98 mg/dL (ref 70–99)
Potassium: 4.1 mmol/L (ref 3.5–5.1)
Sodium: 139 mmol/L (ref 135–145)
Total Bilirubin: 0.2 mg/dL (ref 0.0–1.2)
Total Protein: 7.1 g/dL (ref 6.5–8.1)

## 2024-03-10 LAB — RESP PANEL BY RT-PCR (RSV, FLU A&B, COVID)  RVPGX2
Influenza A by PCR: NEGATIVE
Influenza B by PCR: NEGATIVE
Resp Syncytial Virus by PCR: NEGATIVE
SARS Coronavirus 2 by RT PCR: NEGATIVE

## 2024-03-10 LAB — URINALYSIS, ROUTINE W REFLEX MICROSCOPIC
Bilirubin Urine: NEGATIVE
Glucose, UA: NEGATIVE mg/dL
Hgb urine dipstick: NEGATIVE
Ketones, ur: NEGATIVE mg/dL
Leukocytes,Ua: NEGATIVE
Nitrite: NEGATIVE
Protein, ur: NEGATIVE mg/dL
Specific Gravity, Urine: 1.019 (ref 1.005–1.030)
pH: 5 (ref 5.0–8.0)

## 2024-03-10 LAB — GROUP A STREP BY PCR: Group A Strep by PCR: NOT DETECTED

## 2024-03-10 LAB — LIPASE, BLOOD: Lipase: 31 U/L (ref 11–51)

## 2024-03-10 MED ORDER — ONDANSETRON 4 MG PO TBDP
4.0000 mg | ORAL_TABLET | Freq: Once | ORAL | Status: AC
  Administered 2024-03-10: 4 mg via ORAL
  Filled 2024-03-10: qty 1

## 2024-03-10 MED ORDER — ONDANSETRON HCL 4 MG/2ML IJ SOLN
4.0000 mg | Freq: Once | INTRAMUSCULAR | Status: DC
  Filled 2024-03-10: qty 2

## 2024-03-10 MED ORDER — ONDANSETRON HCL 4 MG PO TABS: 4.0000 mg | ORAL_TABLET | Freq: Three times a day (TID) | ORAL | 0 refills | Status: AC | PRN

## 2024-03-10 NOTE — Discharge Instructions (Signed)
 You constellation of symptoms and today's exam suggest you have a viral illness which should run its course, most viruses last between 7 and 10 days.  Your lab tests, chest x-ray are all normal.  I have prescribed you some Zofran  to help you with your nausea.  I also encourage you taking Tylenol  or ibuprofen  if needed for body aches or development any fever, this will also help with your sore throat pain.  Rest and make sure you are drinking plenty of fluids.  Plan to get rechecked by your primary provider if your symptoms do not continue to improve over the next several days.

## 2024-03-10 NOTE — ED Provider Notes (Signed)
 [place] EMERGENCY DEPARTMENT AT [facility] Provider Note   CSN: [id number] Arrival date & time: 03/10/24  1318     Patient presents with: No chief complaint on file.   Larry Hamilton is a 58 y.o. male with a history including bipolar disorder, hypertension, history of kidney stones, schizophrenia, surgical history significant for appendectomy presenting for evaluation of multiple complaints.  He describes a 5-day history of flulike symptoms including nonproductive cough, generalized headache, sore throat, nausea with 2 specific episodes of emesis over the past 2 days.  He also has complaint of bilateral anterior rib pain from frequency of coughing.  He also endorses generalized mid to upper abdominal pain which has been constant since yesterday.  He denies diarrhea but states his stools have been softer than normal.  He has had subjective fever, also endorses sore throat.  He denies dysuria, back or flank pain.  No other family members are ill with similar symptoms.  He has had no treatment prior to arrival.   The history is provided by the patient.       Prior to Admission medications   Medication Sig Start Date End Date Taking? Authorizing Provider  ondansetron  (ZOFRAN ) 4 MG tablet Take 1 tablet (4 mg total) by mouth every 8 (eight) hours as needed for nausea or vomiting. 03/10/24  Yes Zailyn Rowser, PA-C  ALPRAZolam (XANAX) 0.5 MG tablet Take 0.5 mg by mouth 2 (two) times daily. 07/13/20   [provider]  amoxicillin-clavulanate (AUGMENTIN) 875-125 MG tablet Take 1 tablet by mouth 2 (two) times daily. 12/02/22   [provider]  atorvastatin (LIPITOR) 20 MG tablet Take 1 tablet by mouth daily. 07/25/20   [provider]  cyclobenzaprine (FLEXERIL) 10 MG tablet Take 10 mg by mouth as needed. 12/18/20   [provider]  ibuprofen  (ADVIL ) 800 MG tablet Take 800 mg by mouth 3 (three) times daily. 12/18/20   [provider]  lidocaine   (LIDODERM ) 5 % Place 1 patch onto the skin daily. Remove & Discard patch within 12 hours or as directed by MD 02/11/23   Theotis Cameron HERO, PA-C  lisinopril (ZESTRIL) 10 MG tablet Take 1 tablet by mouth daily. 07/24/20   [provider]  QUEtiapine  (SEROQUEL ) 300 MG tablet TAKE 1 TABLET BY MOUTH AT BEDTIME FOR  MOOD  CONTROL Patient taking differently: Take 300 mg by mouth at bedtime. TAKE 1 TABLET BY MOUTH AT BEDTIME FOR  MOOD  CONTROL 03/21/18   Jerrell Niels CROME, MD    Allergies: Rocephin [ceftriaxone sodium in dextrose] and Flomax  [tamsulosin  hcl]    Review of Systems  Constitutional:  Negative for fever.  HENT:  Positive for sore throat. Negative for congestion.   Eyes: Negative.   Respiratory:  Positive for cough. Negative for chest tightness and shortness of breath.   Cardiovascular:  Negative for chest pain.  Gastrointestinal:  Positive for diarrhea, nausea and vomiting. Negative for abdominal distention.       Abd cramping  Genitourinary: Negative.   Musculoskeletal:  Negative for arthralgias, joint swelling and neck pain.  Skin: Negative.  Negative for rash and wound.  Neurological:  Positive for headaches. Negative for dizziness, weakness, light-headedness and numbness.  Psychiatric/Behavioral: Negative.    All other systems reviewed and are negative.   Updated Vital Signs BP 138/76   Pulse 89   Temp 97.9 F (36.6 C) (Oral)   Resp 18   Ht 5' 8 (1.727 m)   Wt 109.8 kg  SpO2 96%   BMI 36.80 kg/m   Physical Exam Vitals and nursing note reviewed.  Constitutional:      Appearance: He is well-developed.  HENT:     Head: Normocephalic and atraumatic.  Eyes:     Conjunctiva/sclera: Conjunctivae normal.  Cardiovascular:     Rate and Rhythm: Normal rate and regular rhythm.     Heart sounds: Normal heart sounds.  Pulmonary:     Effort: Pulmonary effort is normal.     Breath sounds: Normal breath sounds. No wheezing.  Abdominal:     General: Abdomen is  protuberant. Bowel sounds are normal.     Palpations: Abdomen is soft.     Tenderness: There is no abdominal tenderness. There is no guarding.  Musculoskeletal:        General: Normal range of motion.     Cervical back: Normal range of motion.  Skin:    General: Skin is warm and dry.  Neurological:     Mental Status: He is alert.     (all labs ordered are listed, but only abnormal results are displayed) Labs Reviewed  RESP PANEL BY RT-PCR (RSV, FLU A&B, COVID)  [id number]  GROUP A STREP BY PCR  CBC WITH DIFFERENTIAL/PLATELET  COMPREHENSIVE METABOLIC PANEL WITH GFR  LIPASE, BLOOD  URINALYSIS, ROUTINE W REFLEX MICROSCOPIC    EKG: None  Radiology: No results found. Results for orders placed or performed during the hospital encounter of 03/10/24  Urinalysis, Routine w reflex microscopic -Urine, Clean Catch   Collection Time: 03/10/24  2:06 PM  Result Value Ref Range   Color, Urine YELLOW YELLOW   APPearance CLEAR CLEAR   Specific Gravity, Urine 1.019 1.005 - 1.030   pH 5.0 5.0 - 8.0   Glucose, UA NEGATIVE NEGATIVE mg/dL   Hgb urine dipstick NEGATIVE NEGATIVE   Bilirubin Urine NEGATIVE NEGATIVE   Ketones, ur NEGATIVE NEGATIVE mg/dL   Protein, ur NEGATIVE NEGATIVE mg/dL   Nitrite NEGATIVE NEGATIVE   Leukocytes,Ua NEGATIVE NEGATIVE  Resp panel by RT-PCR (RSV, Flu A&B, Covid) Anterior Nasal Swab   Collection Time: 03/10/24  2:20 PM   Specimen: Anterior Nasal Swab  Result Value Ref Range   SARS Coronavirus 2 by RT PCR NEGATIVE NEGATIVE   Influenza A by PCR NEGATIVE NEGATIVE   Influenza B by PCR NEGATIVE NEGATIVE   Resp Syncytial Virus by PCR NEGATIVE NEGATIVE  CBC with Differential   Collection Time: 03/10/24  2:21 PM  Result Value Ref Range   WBC 8.1 4.0 - 10.5 K/uL   RBC 5.10 4.22 - 5.81 MIL/uL   Hemoglobin 15.3 13.0 - 17.0 g/dL   HCT 54.4 60.9 - 47.9 %   MCV 89.2 80.0 - 100.0 fL   MCH 30.0 26.0 - 34.0 pg   MCHC 33.6 30.0 - 36.0 g/dL   RDW 87.6 88.4 - 84.4 %    Platelets 254 150 - 400 K/uL   nRBC 0.0 0.0 - 0.2 %   Neutrophils Relative % 56 %   Neutro Abs 4.5 1.7 - 7.7 K/uL   Lymphocytes Relative 34 %   Lymphs Abs 2.7 0.7 - 4.0 K/uL   Monocytes Relative 7 %   Monocytes Absolute 0.6 0.1 - 1.0 K/uL   Eosinophils Relative 2 %   Eosinophils Absolute 0.2 0.0 - 0.5 K/uL   Basophils Relative 1 %   Basophils Absolute 0.1 0.0 - 0.1 K/uL   Immature Granulocytes 0 %   Abs Immature Granulocytes 0.02 0.00 - 0.07 K/uL  Comprehensive  metabolic panel   Collection Time: 03/10/24  2:21 PM  Result Value Ref Range   Sodium 139 135 - 145 mmol/L   Potassium 4.1 3.5 - 5.1 mmol/L   Chloride 106 98 - 111 mmol/L   CO2 24 22 - 32 mmol/L   Glucose, Bld 98 70 - 99 mg/dL   BUN 15 6 - 20 mg/dL   Creatinine, Ser 9.22 0.61 - 1.24 mg/dL   Calcium 9.3 8.9 - 89.6 mg/dL   Total Protein 7.1 6.5 - 8.1 g/dL   Albumin 4.2 3.5 - 5.0 g/dL   AST 15 15 - 41 U/L   ALT 18 0 - 44 U/L   Alkaline Phosphatase 66 38 - 126 U/L   Total Bilirubin 0.2 0.0 - 1.2 mg/dL   GFR, Estimated >39 >39 mL/min   Anion gap 10 5 - 15  Lipase, blood   Collection Time: 03/10/24  2:21 PM  Result Value Ref Range   Lipase 31 11 - 51 U/L  Group A Strep by PCR   Collection Time: 03/10/24  3:10 PM   Specimen: Throat; Sterile Swab  Result Value Ref Range   Group A Strep by PCR NOT DETECTED NOT DETECTED   DG Chest Portable 1 View Result Date: 03/10/2024 CLINICAL DATA:  Cough with headache and nausea vomiting for 2 days. EXAM: PORTABLE CHEST 1 VIEW COMPARISON:  07/26/2023. FINDINGS: Cardiac silhouette is normal in size and configuration. No mediastinal or hilar masses. Clear lungs.  No pleural effusion or pneumothorax. Skeletal structures are grossly intact. IMPRESSION: No active disease. Electronically Signed   By: Alm Parkins M.D.   On: 03/10/2024 14:42      Procedures   Medications Ordered in the ED  ondansetron  (ZOFRAN -ODT) disintegrating tablet 4 mg (4 mg Oral Given 03/10/24 1439)                                     Medical Decision Making Patient presenting with multiple symptoms, all suggesting possible flulike illness including a nonproductive cough, sore throat, myalgias generalized headache, nausea, softer than normal stool along with intermittent abdominal cramping which improves after bowel movement.  He denies shortness of breath, chest pain, dysuria.  No fevers documented.  Labs and imaging were obtained as outlined below.  Patient was given a dose of Zofran  while here, at recheck his symptoms had completely resolved and he was able to tolerate p.o. intake while here, reexam, no abdominal pain, benign abdominal exam.  Amount and/or Complexity of Data Reviewed Labs: ordered.    Details: Labs including c-Met, CBC urinalysis respiratory panel and group A strep negative. Radiology: ordered.    Details: Chest x-ray negative.  Risk Prescription drug management. Risk Details: Patient stable, vital signs normal, symptoms resolved after receiving Zofran .  He is prescribed additional Zofran , as needed follow-up anticipated, return precautions were given.        Final diagnoses:  Viral syndrome    ED Discharge Orders          Ordered    ondansetron  (ZOFRAN ) 4 MG tablet  Every 8 hours PRN        03/10/24 1713               Kadence Mikkelson, PA-C 03/12/24 1906    Francesca Elsie CROME, MD 03/12/24 2009

## 2024-03-10 NOTE — ED Notes (Signed)
 X-ray at bedside.

## 2024-03-10 NOTE — ED Triage Notes (Signed)
 Pt c/o cough since Sunday, +HA, N/V x 2 days. Pain to ribs from coughing per pt. Denies any known sick contacts.
